# Patient Record
Sex: Male | Born: 1970 | Hispanic: No | Marital: Married | Smoking: Never smoker
Health system: Southern US, Community
[De-identification: ages and names within clinical notes are randomized; demographics above are authoritative.]

## PROBLEM LIST (undated history)

## (undated) DIAGNOSIS — R51 Headache: Secondary | ICD-10-CM

## (undated) DIAGNOSIS — E559 Vitamin D deficiency, unspecified: Secondary | ICD-10-CM

## (undated) DIAGNOSIS — E7800 Pure hypercholesterolemia, unspecified: Secondary | ICD-10-CM

## (undated) DIAGNOSIS — J302 Other seasonal allergic rhinitis: Secondary | ICD-10-CM

## (undated) DIAGNOSIS — D490 Neoplasm of unspecified behavior of digestive system: Secondary | ICD-10-CM

## (undated) DIAGNOSIS — E119 Type 2 diabetes mellitus without complications: Secondary | ICD-10-CM

## (undated) DIAGNOSIS — R519 Headache, unspecified: Secondary | ICD-10-CM

## (undated) DIAGNOSIS — K219 Gastro-esophageal reflux disease without esophagitis: Secondary | ICD-10-CM

## (undated) DIAGNOSIS — G4700 Insomnia, unspecified: Secondary | ICD-10-CM

## (undated) DIAGNOSIS — I10 Essential (primary) hypertension: Secondary | ICD-10-CM

## (undated) HISTORY — PX: UPPER GI ENDOSCOPY: SHX6162

## (undated) HISTORY — PX: WISDOM TOOTH EXTRACTION: SHX21

---

## 2017-09-03 ENCOUNTER — Other Ambulatory Visit (HOSPITAL_COMMUNITY): Payer: Self-pay | Admitting: Surgery

## 2017-09-15 ENCOUNTER — Encounter: Payer: BLUE CROSS/BLUE SHIELD | Attending: Surgery | Admitting: Skilled Nursing Facility1

## 2017-09-15 ENCOUNTER — Ambulatory Visit (HOSPITAL_COMMUNITY)
Admission: RE | Admit: 2017-09-15 | Discharge: 2017-09-15 | Disposition: A | Discharge status: Home / Self Care | Payer: BLUE CROSS/BLUE SHIELD | Source: Ambulatory Visit | Attending: Surgery | Admitting: Surgery

## 2017-09-15 ENCOUNTER — Other Ambulatory Visit: Payer: Self-pay

## 2017-09-15 ENCOUNTER — Encounter: Payer: Self-pay | Admitting: Skilled Nursing Facility1

## 2017-09-15 DIAGNOSIS — I498 Other specified cardiac arrhythmias: Secondary | ICD-10-CM | POA: Insufficient documentation

## 2017-09-15 DIAGNOSIS — Z01810 Encounter for preprocedural cardiovascular examination: Secondary | ICD-10-CM | POA: Insufficient documentation

## 2017-09-15 DIAGNOSIS — Z01818 Encounter for other preprocedural examination: Secondary | ICD-10-CM | POA: Insufficient documentation

## 2017-09-15 DIAGNOSIS — J9811 Atelectasis: Secondary | ICD-10-CM | POA: Insufficient documentation

## 2017-09-15 DIAGNOSIS — Z6838 Body mass index (BMI) 38.0-38.9, adult: Secondary | ICD-10-CM | POA: Diagnosis not present

## 2017-09-15 DIAGNOSIS — E669 Obesity, unspecified: Secondary | ICD-10-CM

## 2017-09-15 NOTE — Progress Notes (Signed)
Pre-Op Assessment Visit:  Pre-Operative RYGB Surgery  Medical Nutrition Therapy:  Appt start time: 12:30  End time:  1:15  Patient was seen on 09/15/2017 for Pre-Operative Nutrition Assessment. Assessment and letter of approval faxed to [facility] Bariatric Surgery Program coordinator on 09/15/2017.    Pt states from not sleeping well at night has mood changes like anger. Pt has a physically active job. Pt states he has been trying to only have decaf coffee. Pt staets he will not be able to come back for [facility] appointment due to work schedule so he will call [facility].    Start weight at [facility]: 237.7 BMI: 38.95  24 hr Dietary Recall: First Meal: skipped Snack:  Second Meal: fast food subs or pizza  Snack: fruit Third Meal: pizza or naan lentils and rice  Snack:  Beverages: decaff coffee,   Encouraged to engage in 150 minutes of moderate physical activity including cardiovascular and weight baring weekly  Handouts given during visit include:  . Pre-Op Goals . Bariatric Surgery Protein Shakes During the appointment today the following Pre-Op Goals were reviewed with the patient: . Maintain or lose weight as instructed by your surgeon . Make healthy food choices . Begin to limit portion sizes . Limited concentrated sugars and fried foods . Keep fat/sugar in the single digits per serving on             food labels . Practice CHEWING your food  (aim for 30 chews per bite or until applesauce consistency) . Practice not drinking 15 minutes before, during, and 30 minutes after each meal/snack . Avoid all carbonated beverages  . Avoid/limit caffeinated beverages  . Avoid all sugar-sweetened beverages . Consume 3 meals per day; eat every 3-5 hours . Make a list of non-food related activities . Aim for 64-100 ounces of FLUID daily  . Aim for at least 60-80 grams of PROTEIN daily . Look for a liquid protein source that contain ?15 g protein and ?5 g carbohydrate  (ex: shakes,  drinks, shots)  -Follow diet recommendations listed below   Energy and Macronutrient Recomendations: Calories: 1600 Carbohydrate: 180 Protein: 120 Fat: 44  Demonstrated degree of understanding via:  Teach Back  Teaching Method Utilized:  Visual Auditory Hands on  Barriers to learning/adherence to lifestyle change: none identified  Patient to call the [facility] to enroll in Pre-Op and Post-Op Nutrition Education when surgery date is scheduled.

## 2017-10-11 ENCOUNTER — Encounter: Payer: BLUE CROSS/BLUE SHIELD | Attending: Surgery | Admitting: Skilled Nursing Facility1

## 2017-10-11 ENCOUNTER — Encounter: Payer: Self-pay | Admitting: Skilled Nursing Facility1

## 2017-10-11 DIAGNOSIS — Z6838 Body mass index (BMI) 38.0-38.9, adult: Secondary | ICD-10-CM | POA: Insufficient documentation

## 2017-10-11 DIAGNOSIS — E669 Obesity, unspecified: Secondary | ICD-10-CM

## 2017-10-11 NOTE — Progress Notes (Signed)
RYGB Assessment:   1st SWL Appointment.   Pt states from not sleeping well at night has mood changes like anger. Pt has a physically active job. Pt states he has been trying to only have decaf coffee.    Pt states he needs 2 more SWL appointments. Pt states he has been trying to chew 28 times and states he is doing good. Pt states his wife has had bariatric surgery.  Hypertension, GERD, Hypercholesterolemia   Start weight at [facility]: 237.7 Wt: 238.3 BMI: 39.05  MEDICATIONS: see List    DIETARY INTAKE:  24-hr recall:  B ( AM): skipped Snk ( AM):  L ( PM): fast food or soup or 1-2 cans of tuna  Snk ( PM): hot cheetos  D ( PM): fish or chicken or restaurant food Snk ( PM):  Beverages: decaff coffee, 3-4 bottles of water  Usual physical activity: Active job and som treadmill   Diet to Follow: 1600 calories 180 g carbohydrates 120 g protein 44 g fat   Nutritional Diagnosis:  [place]-3.3 Overweight/obesity related to past poor dietary habits and physical inactivity as evidenced by patient w/ planned RYGB surgery following dietary guidelines for continued weight loss.    Intervention:  Nutrition counseling for upcoming Bariatric Surgery. Goals: -Encouraged to engage in 150 minutes of moderate physical activity including cardiovascular and weight baring weekly -Work on eating 3 meals a day  Teaching Method Utilized:  Visual Auditory Hands on  Barriers to learning/adherence to lifestyle change: none identified   Demonstrated degree of understanding via:  Teach Back   Monitoring/Evaluation:  Dietary intake, exercise, and body weight prn.

## 2017-10-28 ENCOUNTER — Ambulatory Visit: Payer: Self-pay | Admitting: Psychiatry

## 2017-11-11 ENCOUNTER — Ambulatory Visit: Payer: BLUE CROSS/BLUE SHIELD | Admitting: Psychiatry

## 2017-12-13 ENCOUNTER — Encounter: Payer: Self-pay | Admitting: Skilled Nursing Facility1

## 2017-12-13 ENCOUNTER — Encounter: Payer: BLUE CROSS/BLUE SHIELD | Attending: Surgery | Admitting: Skilled Nursing Facility1

## 2017-12-13 DIAGNOSIS — Z6838 Body mass index (BMI) 38.0-38.9, adult: Secondary | ICD-10-CM | POA: Insufficient documentation

## 2017-12-13 NOTE — Progress Notes (Signed)
RYGB Assessment:   2nd SWL Appointment.   Pt states from not sleeping well at night has mood changes like anger. Pt has a physically active job. Pt states he has been trying to only have decaf coffee.    Pt states he needs 1 more SWL appointments. Pt states he has been trying to chew 28 times and states he is doing good. Pt states his wife has had bariatric surgery.  Hypertension, GERD, Hypercholesterolemia  Pt arrives having lost about 2 pounds. Pt states he has been working on eating throughout the day. Pt states he has been trying to eat less fast food. Pt state she has had more energy and is feeling better. Pt states he is always bloated and gassy 30 minutes after lunch.    Start weight at [facility]: 237.7 Wt: 236 BMI: 38.81  MEDICATIONS: see List    DIETARY INTAKE:  24-hr recall:  B ( AM): skipped or banana  Snk ( AM):  L ( PM): fast food or soup or 1-2 cans of tuna  Snk ( PM): hot cheetos  D ( PM): fish or chicken or restaurant food or sushi and nuts or wings  Snk ( PM): cheetos Beverages: decaff coffee, 3-4 bottles of water  Usual physical activity: Active job and some treadmill   Diet to Follow: 1600 calories 180 g carbohydrates 120 g protein 44 g fat   Nutritional Diagnosis:  [place]-3.3 Overweight/obesity related to past poor dietary habits and physical inactivity as evidenced by patient w/ planned RYGB surgery following dietary guidelines for continued weight loss.    Intervention:  Nutrition counseling for upcoming Bariatric Surgery. Goals: -Encouraged to engage in 150 minutes of moderate physical activity including cardiovascular and weight baring weekly -Work on eating 3 meals a day: eat breakfast   Teaching Method Utilized:  Visual Auditory Hands on  Barriers to learning/adherence to lifestyle change: none identified   Demonstrated degree of understanding via:  Teach Back   Monitoring/Evaluation:  Dietary intake, exercise, and body weight prn.

## 2017-12-23 ENCOUNTER — Ambulatory Visit: Payer: BLUE CROSS/BLUE SHIELD | Admitting: Psychiatry

## 2017-12-23 ENCOUNTER — Ambulatory Visit (INDEPENDENT_AMBULATORY_CARE_PROVIDER_SITE_OTHER): Payer: BLUE CROSS/BLUE SHIELD | Admitting: Psychiatry

## 2017-12-23 DIAGNOSIS — F509 Eating disorder, unspecified: Secondary | ICD-10-CM | POA: Diagnosis not present

## 2017-12-24 DIAGNOSIS — Z6841 Body Mass Index (BMI) 40.0 and over, adult: Secondary | ICD-10-CM | POA: Diagnosis not present

## 2017-12-24 DIAGNOSIS — E559 Vitamin D deficiency, unspecified: Secondary | ICD-10-CM | POA: Diagnosis not present

## 2018-01-03 DIAGNOSIS — Z6841 Body Mass Index (BMI) 40.0 and over, adult: Secondary | ICD-10-CM | POA: Diagnosis not present

## 2018-01-03 DIAGNOSIS — E559 Vitamin D deficiency, unspecified: Secondary | ICD-10-CM | POA: Diagnosis not present

## 2018-01-10 ENCOUNTER — Encounter: Payer: BLUE CROSS/BLUE SHIELD | Attending: Surgery | Admitting: Skilled Nursing Facility1

## 2018-01-10 ENCOUNTER — Encounter: Payer: Self-pay | Admitting: Skilled Nursing Facility1

## 2018-01-10 DIAGNOSIS — Z6838 Body mass index (BMI) 38.0-38.9, adult: Secondary | ICD-10-CM | POA: Insufficient documentation

## 2018-01-10 DIAGNOSIS — E669 Obesity, unspecified: Secondary | ICD-10-CM

## 2018-01-10 NOTE — Progress Notes (Signed)
RYGB Assessment: 3rd SWL Appointment.   Pt states from not sleeping well at night has mood changes like anger. Pt has a physically active job. Pt states he has been trying to only have decaf coffee.    Pt states he needs 1 more SWL appointments. Pt states he has been trying to chew 28 times and states he is doing good. Pt states his wife has had bariatric surgery.  Hypertension, GERD, Hypercholesterolemia Pt states he has been working on eating throughout the day. Pt states he has been trying to eat less fast food. Pt state she has had more energy and is feeling better. Pt states he is always bloated and gassy 30 minutes after lunch.  Arrived having Lost 3 pounds. Pt states he is eating a lot more fish and soup and eating less fast food. Pt states he has trouble sleeping at night taking medication which helps a little.  Pt states Dr. Hassell Done will call him and tell him if he needs another SWL: Dietitian advised he call [facility] when he knows.    Start weight at [facility]: 237.7 Wt: 233 BMI: 38.22  MEDICATIONS: see List    DIETARY INTAKE:  24-hr recall:  B ( AM): skipped or banana  Snk ( AM):  L ( PM): fast food or soup or 1-2 cans of tuna  Snk ( PM): hot cheetos  D ( PM): fish or chicken or restaurant food or sushi and nuts or wings  Snk ( PM): cheetos Beverages: decaff coffee, 3-4 bottles of water  Usual physical activity: Active job and treadmill for 60 minutes 5 days a week   Diet to Follow: 1600 calories 180 g carbohydrates 120 g protein 44 g fat   Nutritional Diagnosis:  [place]-3.3 Overweight/obesity related to past poor dietary habits and physical inactivity as evidenced by patient w/ planned RYGB surgery following dietary guidelines for continued weight loss.    Intervention:  Nutrition counseling for upcoming Bariatric Surgery. Goals: -Encouraged to engage in 150 minutes of moderate physical activity including cardiovascular and weight baring weekly -Work on eating 3 meals a day:  eat breakfast; try protein shake in the morning  -chew small bites until applesauce consistency  -Do not drink with meals Teaching Method Utilized:  Visual Auditory Hands on  Barriers to learning/adherence to lifestyle change: none identified   Demonstrated degree of understanding via:  Teach Back   Monitoring/Evaluation:  Dietary intake, exercise, and body weight prn.

## 2018-01-18 ENCOUNTER — Ambulatory Visit (INDEPENDENT_AMBULATORY_CARE_PROVIDER_SITE_OTHER): Payer: BLUE CROSS/BLUE SHIELD | Admitting: Psychiatry

## 2018-01-18 DIAGNOSIS — F509 Eating disorder, unspecified: Secondary | ICD-10-CM | POA: Diagnosis not present

## 2018-02-07 DIAGNOSIS — J029 Acute pharyngitis, unspecified: Secondary | ICD-10-CM | POA: Diagnosis not present

## 2018-02-07 DIAGNOSIS — J069 Acute upper respiratory infection, unspecified: Secondary | ICD-10-CM | POA: Diagnosis not present

## 2018-02-22 ENCOUNTER — Encounter: Payer: Self-pay | Admitting: Skilled Nursing Facility1

## 2018-02-22 ENCOUNTER — Encounter: Payer: BLUE CROSS/BLUE SHIELD | Attending: Surgery | Admitting: Skilled Nursing Facility1

## 2018-02-22 DIAGNOSIS — Z6838 Body mass index (BMI) 38.0-38.9, adult: Secondary | ICD-10-CM | POA: Diagnosis not present

## 2018-02-22 DIAGNOSIS — E669 Obesity, unspecified: Secondary | ICD-10-CM

## 2018-02-22 NOTE — Progress Notes (Signed)
RYGB Assessment: 4th SWL Appointment.   Pt arrives having gained about 3 pounds. Pt states for the last 2 weeks he has been very tired after work realizing he was not eating enough for lunch. Pt states he will not have anything he will struggle with after surgery.    Start weight at [facility]: 237.7 Wt: 236 BMI: 38.74  MEDICATIONS: see List    DIETARY INTAKE:  24-hr recall:  B ( AM): skipped or banana or cereal or waffle  Snk ( AM):  L ( PM): fast food or soup or 1-2 cans of tuna or something he cannot remember the name of or ramen oodles with chicken  Snk ( PM): hot cheetos  D ( PM): fish or chicken or restaurant food or sushi and nuts or wings or fast food Snk ( PM): cheetos Beverages: decaff coffee, 3-4 bottles of water  Usual physical activity: Active job   Diet to Follow: 1600 calories 180 g carbohydrates 120 g protein 44 g fat   Nutritional Diagnosis:  [place]-3.3 Overweight/obesity related to past poor dietary habits and physical inactivity as evidenced by patient w/ planned RYGB surgery following dietary guidelines for continued weight loss.    Intervention:  Nutrition counseling for upcoming Bariatric Surgery. Goals: -Encouraged to engage in 150 minutes of moderate physical activity including cardiovascular and weight baring weekly -Work on eating 3 meals a day: eat breakfast; try protein shake in the morning  -chew small bites until applesauce consistency  -Do not drink with meals Teaching Method Utilized:  Visual Auditory Hands on  Barriers to learning/adherence to lifestyle change: none identified   Demonstrated degree of understanding via:  Teach Back   Monitoring/Evaluation:  Dietary intake, exercise, and body weight prn.

## 2018-03-21 ENCOUNTER — Ambulatory Visit: Payer: Self-pay

## 2018-05-28 DIAGNOSIS — Z6841 Body Mass Index (BMI) 40.0 and over, adult: Secondary | ICD-10-CM | POA: Diagnosis not present

## 2018-05-30 DIAGNOSIS — K1321 Leukoplakia of oral mucosa, including tongue: Secondary | ICD-10-CM | POA: Diagnosis not present

## 2018-05-30 DIAGNOSIS — K1329 Other disturbances of oral epithelium, including tongue: Secondary | ICD-10-CM | POA: Diagnosis not present

## 2018-06-06 ENCOUNTER — Ambulatory Visit: Payer: Self-pay

## 2018-06-13 ENCOUNTER — Encounter: Payer: BLUE CROSS/BLUE SHIELD | Attending: Surgery | Admitting: Registered"

## 2018-06-13 DIAGNOSIS — E781 Pure hyperglyceridemia: Secondary | ICD-10-CM | POA: Diagnosis not present

## 2018-06-13 DIAGNOSIS — K219 Gastro-esophageal reflux disease without esophagitis: Secondary | ICD-10-CM | POA: Diagnosis not present

## 2018-06-13 DIAGNOSIS — Z6836 Body mass index (BMI) 36.0-36.9, adult: Secondary | ICD-10-CM | POA: Insufficient documentation

## 2018-06-13 DIAGNOSIS — Z79899 Other long term (current) drug therapy: Secondary | ICD-10-CM | POA: Insufficient documentation

## 2018-06-13 DIAGNOSIS — Z713 Dietary counseling and surveillance: Secondary | ICD-10-CM | POA: Insufficient documentation

## 2018-06-13 DIAGNOSIS — Z8249 Family history of ischemic heart disease and other diseases of the circulatory system: Secondary | ICD-10-CM | POA: Diagnosis not present

## 2018-06-13 DIAGNOSIS — E669 Obesity, unspecified: Secondary | ICD-10-CM

## 2018-06-13 NOTE — Progress Notes (Signed)
  Pre-Operative Nutrition Class:  Appt start time: 8:15   End time:  9:15.  Patient was seen on 06/13/2018 for Pre-Operative Bariatric Surgery Education [facility].   Surgery date: TBD Surgery type: RYGB Start weight at [facility]: 237.7 Weight today: 222.0   Samples given per MNT protocol. Patient educated on appropriate usage: Bariatric Advantage Multivitamin Lot # [id number] Exp: 08/2019  Bariatric Advantage Calcium Citrate Lot # [id number] Exp: 12/06/2018  Bariatric Advantage Calcium Citrate Lot # [id number] Exp: 03/17/2019  Premier Protein Shake Lot # [id number] Exp: 02/22/2019   The following the learning objectives were met by the patient during this course:  Identify Pre-Op Dietary Goals and will begin 2 weeks pre-operatively  Identify appropriate sources of fluids and proteins   State protein recommendations and appropriate sources pre and post-operatively  Identify Post-Operative Dietary Goals and will follow for 2 weeks post-operatively  Identify appropriate multivitamin and calcium sources  Describe the need for physical activity post-operatively and will follow MD recommendations  State when to call healthcare provider regarding medication questions or post-operative complications  Handouts given during class include:  Pre-Op Bariatric Surgery Diet Handout  Protein Shake Handout  Post-Op Bariatric Surgery Nutrition Handout  BEL[facility] Program Information Flyer  Support Group Information Flyer  [facility] Bariatric Supplements Price List  Follow-Up Plan: Patient will follow-up at [facility] 2 weeks post operatively for diet advancement per MD.

## 2018-07-06 DIAGNOSIS — K1230 Oral mucositis (ulcerative), unspecified: Secondary | ICD-10-CM | POA: Diagnosis not present

## 2018-07-06 DIAGNOSIS — D3709 Neoplasm of uncertain behavior of other specified sites of the oral cavity: Secondary | ICD-10-CM | POA: Diagnosis not present

## 2018-08-17 DIAGNOSIS — K011 Impacted teeth: Secondary | ICD-10-CM | POA: Diagnosis not present

## 2018-10-01 IMAGING — DX DG CHEST 2V
2 series · 2 of 2 positions shown · non-contrast
Comparison: None.

CLINICAL DATA: Shortness of breath for 2 years, preoperative
evaluation for gastric surgery

EXAM:
CHEST  2 VIEW

[chest pa]
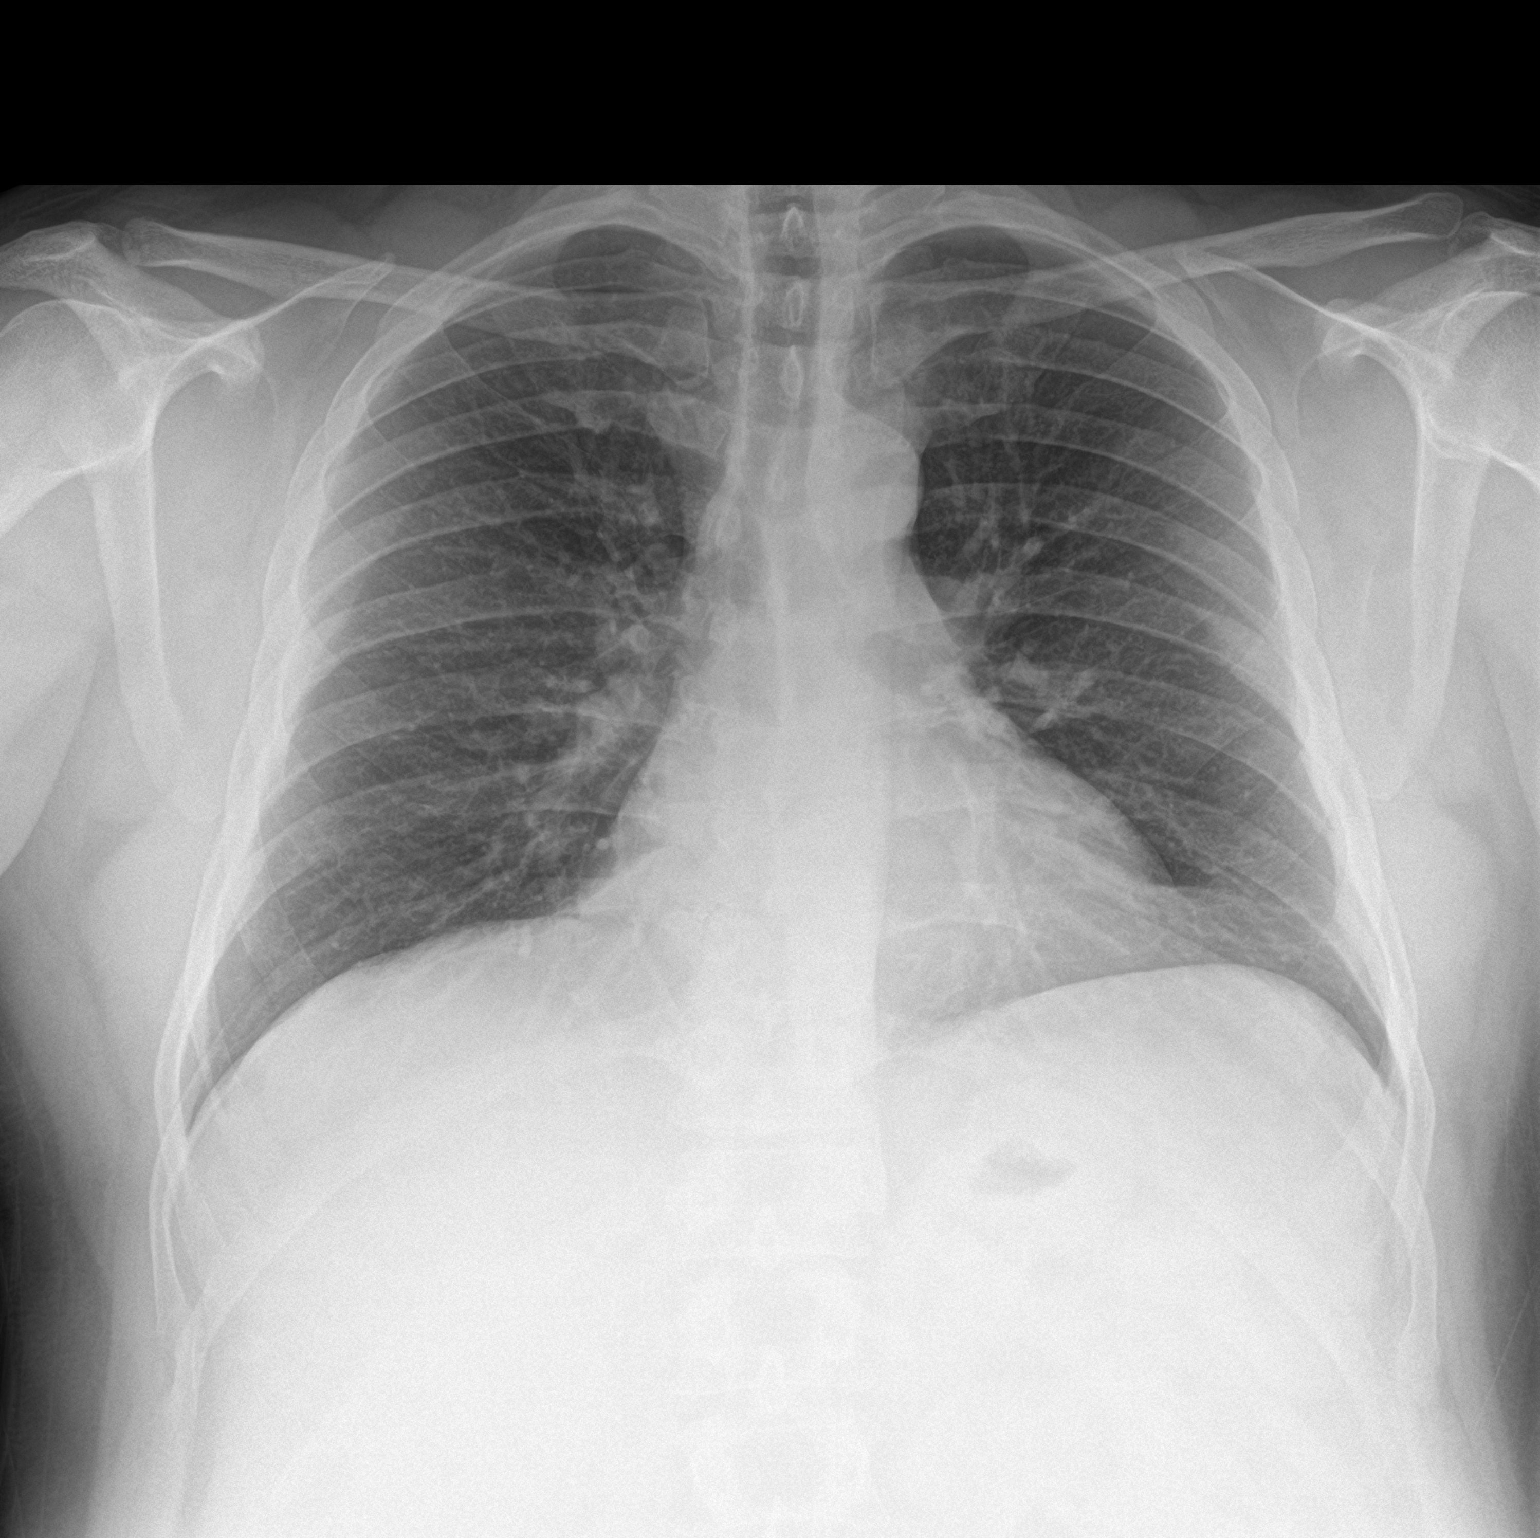

[chest lat]
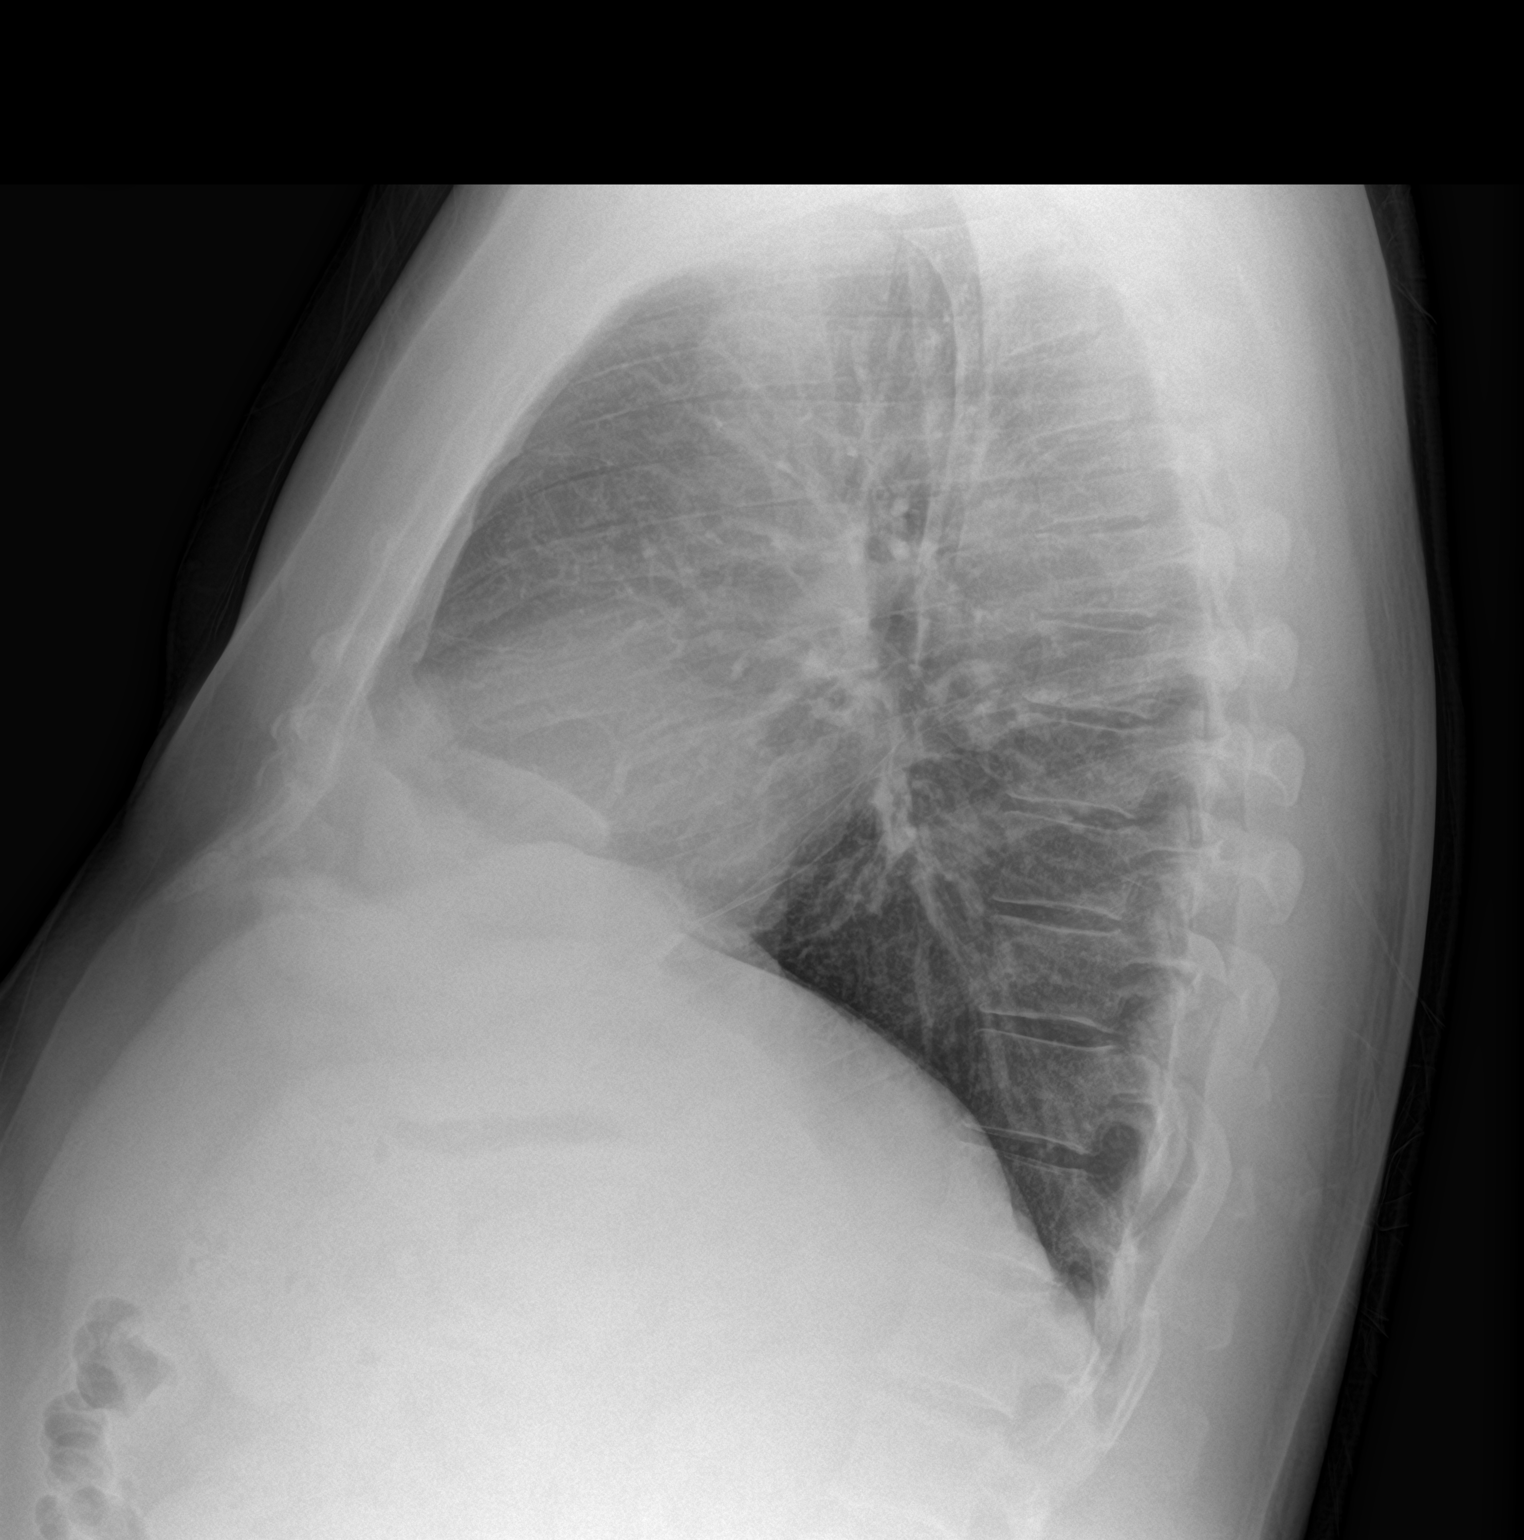

[2 of 2 positions shown; findings below may reference images not displayed]

FINDINGS: The heart size and mediastinal contours are within normal limits.
Both lungs are well aerated bilaterally. Minimal lingular
atelectasis is seen. The visualized skeletal structures are
unremarkable.
IMPRESSION: Minimal lingular atelectasis.

## 2018-10-19 ENCOUNTER — Telehealth: Payer: Self-pay | Admitting: Skilled Nursing Facility1

## 2018-10-19 NOTE — Telephone Encounter (Signed)
Dietitian called pt to assess their understanding of the pre-op nutrition recommendations through the teach back method to ensure the pts knowledge readiness in preparation for surgery.     Pt was confused and wanted to know when his surgery date is. Pt states he has been calling [facility] with no response form them so he does not know when he is having surgery or when any of his appts are.   Dietitian told pt she does not know that side of his process she just needed to educate him on the pre-op nutrition information. Pt stated he will call [facility] to set up another pre-op class because he does not remember the information. Pt states he will call [facility] again to find out when he is getting surgery.

## 2018-10-19 NOTE — Telephone Encounter (Signed)
Dietitian called pt to assess their understanding of the pre-op nutrition recommendations through the teach back method to ensure the pts knowledge readiness in preparation for surgery.     Dietitian LVM.

## 2018-10-21 ENCOUNTER — Ambulatory Visit: Payer: Self-pay | Admitting: Surgery

## 2018-10-21 DIAGNOSIS — E8881 Metabolic syndrome: Secondary | ICD-10-CM | POA: Diagnosis not present

## 2018-11-01 ENCOUNTER — Inpatient Hospital Stay: Admit: 2018-11-01 | Payer: BLUE CROSS/BLUE SHIELD | Admitting: Surgery

## 2018-11-01 SURGERY — LAPAROSCOPIC ROUX-EN-Y GASTRIC BYPASS WITH UPPER ENDOSCOPY
Anesthesia: General

## 2018-11-15 DIAGNOSIS — Z6841 Body Mass Index (BMI) 40.0 and over, adult: Secondary | ICD-10-CM | POA: Diagnosis not present

## 2019-01-04 DIAGNOSIS — K1230 Oral mucositis (ulcerative), unspecified: Secondary | ICD-10-CM | POA: Diagnosis not present

## 2019-01-04 DIAGNOSIS — H9209 Otalgia, unspecified ear: Secondary | ICD-10-CM | POA: Diagnosis not present

## 2019-01-19 ENCOUNTER — Encounter (HOSPITAL_COMMUNITY): Payer: Self-pay

## 2019-01-19 NOTE — Patient Instructions (Addendum)
Your procedure is scheduled on:  Tuesday, January 31, 2019   Surgery Time:  7:15AM-10:34AM   Report to [facility]  Entrance    Report to Short Stay at 5:30AM   Call this number if you have problems the morning of surgery [phone]   NO SOLID FOOD AFTER 600 PM THE NIGHT BEFORE YOUR SURGERY. YOU MAY DRINK CLEAR FLUIDS.   CLEAR LIQUID DIET  Foods Allowed                                                                     Foods Excluded  Water, Black Coffee and tea, regular and decaf                             liquids that you cannot  Plain Jell-O in any flavor                                             see through such as: Fruit ices (not with fruit pulp)                                     milk, soups, orange juice  Iced Popsicles                                    All solid food Carbonated beverages, regular and diet                                    Cranberry, grape and apple juices Sports drinks like Gatorade Lightly seasoned clear broth or consume(fat free) Sugar, honey syrup  Sample Menu Breakfast                                Lunch                                     Supper Cranberry juice                    Beef broth                            Chicken broth Jell-O                                     Grape juice                           Apple juice Coffee or tea  Jell-O                                      Popsicle                                                Coffee or tea                        Coffee or tea   MORNING OF SURGERY DRINK:  1 G2 low sugar Gatorade drink  BEFORE YOU LEAVE HOME, DRINK ALL OF IT AT ONE TIME.  THE GATORADE YOU DRINK BEFORE YOU LEAVE HOME WILL BE  THE LAST FLUIDS YOU DRINK BEFORE SURGERY.   Brush your teeth the morning of surgery.   Do NOT smoke after Midnight   Take these medicines the morning of surgery with A SIP OF WATER:   DO NOT TAKE ANY DIABETIC MEDICATIONS DAY OF YOUR SURGERY                                You may not have any metal on your body including jewelry, and body piercings             Do not wear lotions, powders, perfumes/cologne, or deodorant                          Men may shave face and neck.   Do not bring valuables to the hospital. [place].   Contacts, dentures or bridgework may not be worn into surgery.   Leave suitcase in the car. After surgery it may be brought to your room.     PAIN IS EXPECTED AFTER SURGERY AND WILL NOT BE COMPLETELY ELIMINATED. AMBULATION AND TYLENOL WILL HELP REDUCE INCISIONAL AND GAS PAIN. MOVEMENT IS KEY!   YOU ARE EXPECTED TO BE OUT OF BED WITHIN 4 HOURS OF ADMISSION TO YOUR PATIENT ROOM.   SITTING IN THE RECLINER THROUGHOUT THE DAY IS IMPORTANT FOR DRINKING FLUIDS AND MOVING GAS THROUGHOUT THE GI TRACT.   COMPRESSION STOCKINGS SHOULD BE WORN [place] UNLESS YOU ARE WALKING.    INCENTIVE SPIROMETER SHOULD BE USED EVERY HOUR WHILE AWAKE TO DECREASE POST-OPERATIVE COMPLICATIONS SUCH AS PNEUMONIA.   WHEN DISCHARGED HOME, IT IS IMPORTANT TO CONTINUE TO WALK EVERY HOUR AND USE THE INCENTIVE SPIROMETER EVERY HOUR.    Special Instructions: Bring a copy of your healthcare power of attorney and living will documents         the day of surgery if you haven't scanned them in before.              Please read over the following fact sheets you were given:    [facility] - Preparing for Surgery Before surgery, you can play an important role.  Because skin is not sterile, your skin needs to be as free of germs as possible.  You can reduce the number of germs on your skin by washing with CHG (chlorahexidine gluconate) soap before surgery.  CHG is an antiseptic cleaner which kills  germs and bonds with the skin to continue killing germs even after washing. Please DO NOT use if you have an allergy to CHG or antibacterial soaps.  If your skin becomes reddened/irritated stop  using the CHG and inform your nurse when you arrive at Short Stay. Do not shave (including legs and underarms) for at least 48 hours prior to the first CHG shower.  You may shave your face/neck.  Please follow these instructions carefully:  1.  Shower with CHG Soap the night before surgery and the  morning of surgery.  2.  If you choose to wash your hair, wash your hair first as usual with your normal  shampoo.  3.  After you shampoo, rinse your hair and body thoroughly to remove the shampoo.                             4.  Use CHG as you would any other liquid soap.  You can apply chg directly to the skin and wash.  Gently with a scrungie or clean washcloth.  5.  Apply the CHG Soap to your body ONLY FROM THE NECK DOWN.   Do   not use on face/ open                           Wound or open sores. Avoid contact with eyes, ears mouth and   genitals (private parts).                       Wash face,  Genitals (private parts) with your normal soap.             6.  Wash thoroughly, paying special attention to the area where your    surgery  will be performed.  7.  Thoroughly rinse your body with warm water from the neck down.  8.  DO NOT shower/wash with your normal soap after using and rinsing off the CHG Soap.                9.  Pat yourself dry with a clean towel.            10.  Wear clean pajamas.            11.  Place clean sheets on your bed the night of your first shower and do not  sleep with pets. Day of Surgery : Do not apply any lotions/deodorants the morning of surgery.  Please wear clean clothes to the hospital/surgery center.  FAILURE TO FOLLOW THESE INSTRUCTIONS MAY RESULT IN THE CANCELLATION OF YOUR SURGERY  PATIENT SIGNATURE_________________________________  NURSE SIGNATURE__________________________________  ________________________________________________________________________

## 2019-01-20 ENCOUNTER — Encounter (HOSPITAL_COMMUNITY)
Admission: RE | Admit: 2019-01-20 | Discharge: 2019-01-20 | Disposition: A | Discharge status: Home / Self Care | Payer: BLUE CROSS/BLUE SHIELD | Source: Ambulatory Visit | Attending: Surgery | Admitting: Surgery

## 2019-01-20 DIAGNOSIS — Z01818 Encounter for other preprocedural examination: Secondary | ICD-10-CM | POA: Diagnosis not present

## 2019-01-20 HISTORY — DX: Neoplasm of unspecified behavior of digestive system: D49.0

## 2019-01-20 HISTORY — DX: Vitamin D deficiency, unspecified: E55.9

## 2019-01-20 HISTORY — DX: Insomnia, unspecified: G47.00

## 2019-01-20 HISTORY — DX: Morbid (severe) obesity due to excess calories: E66.01

## 2019-01-20 HISTORY — DX: Gastro-esophageal reflux disease without esophagitis: K21.9

## 2019-01-27 ENCOUNTER — Telehealth: Payer: Self-pay | Admitting: Dietician

## 2019-01-27 NOTE — Patient Instructions (Addendum)
Mark Lang  01/27/2019   Your procedure is scheduled on: 01-31-2019   Report to Surgery By [facility] Main  Entrance     Report to [place] at 5:30AM    Call this number if you have problems the morning of surgery [phone]      Remember: NO SOLID FOOD AFTER 6:00 PM THE NIGHT BEFORE YOUR SURGERY. YOU MAY DRINK CLEAR FLUIDS (SEE BELOW). ON THA MORNING OF SURGERY DRINK:  ONE G2 GATORADE  BEFORE YOU LEAVE HOME, DRINK ALL OF THE GATORADE AT ONE TIME. DO NOT CONSUME ANYTHING BY MOUTH AFTER DRINKING THE GATORADE   BRUSH YOUR TEETH MORNING OF SURGERY AND RINSE YOUR MOUTH OUT, NO CHEWING GUM CANDY OR MINTS.      CLEAR LIQUID DIET   Foods Allowed                                                                     Foods Excluded  Coffee and tea, regular and decaf                             liquids that you cannot  Plain Jell-O in any flavor                                             see through such as: Fruit ices (not with fruit pulp)                                     milk, soups, orange juice  Iced Popsicles                                    All solid food Carbonated beverages, regular and diet                                    Cranberry, grape and apple juices Sports drinks like Gatorade Lightly seasoned clear broth or consume(fat free) Sugar, honey syrup  Sample Menu Breakfast                                Lunch                                     Supper Cranberry juice                    Beef broth                            Chicken broth Jell-O  Grape juice                           Apple juice Coffee or tea                        Jell-O                                      Popsicle                                                Coffee or tea                        Coffee or tea  _____________________________________________________________________     PAIN IS EXPECTED AFTER SURGERY AND WILL NOT BE COMPLETELY  ELIMINATED. AMBULATION AND TYLENOL WILL HELP REDUCE INCISIONAL AND GAS PAIN. MOVEMENT IS KEY!  YOU ARE EXPECTED TO BE OUT OF BED WITHIN 4 HOURS OF ADMISSION TO YOUR PATIENT ROOM.  SITTING IN THE RECLINER THROUGHOUT THE DAY IS IMPORTANT FOR DRINKING FLUIDS AND MOVING GAS THROUGHOUT THE GI TRACT.  COMPRESSION STOCKINGS SHOULD BE WORN [name] UNLESS YOU ARE WALKING.   INCENTIVE SPIROMETER SHOULD BE USED EVERY HOUR WHILE AWAKE TO DECREASE POST-OPERATIVE COMPLICATIONS SUCH AS PNEUMONIA.  WHEN DISCHARGED HOME, IT IS IMPORTANT TO CONTINUE TO WALK EVERY HOUR AND USE THE INCENTIVE SPIROMETER EVERY HOUR.             Take these medicines the morning of surgery with A SIP OF WATER: Metoprolol, Omeprazole  DO NOT TAKE ANY DIABETES MEDICATION THE DAY OF SURGERY!                                 You may not have any metal on your body including hair pins and              piercings  Do not wear jewelry, make-up, lotions, powders or perfumes, deodorant              Men may shave face and neck.   Do not bring valuables to the hospital. [name].  Contacts, dentures or bridgework may not be worn into surgery.  Leave suitcase in the car. After surgery it may be brought to your room.                Please read over the following fact sheets you were given: _____________________________________________________________________             [name] - Preparing for Surgery Before surgery, you can play an important role.  Because skin is not sterile, your skin needs to be as free of germs as possible.  You can reduce the number of germs on your skin by washing with CHG (chlorahexidine gluconate) soap before surgery.  CHG is an antiseptic cleaner which kills germs and bonds with the skin to continue killing germs even after washing. Please DO NOT use if you have an allergy to CHG or antibacterial soaps.  If  your skin becomes  reddened/irritated stop using the CHG and inform your nurse when you arrive at Short Stay. Do not shave (including legs and underarms) for at least 48 hours prior to the first CHG shower.  You may shave your face/neck. Please follow these instructions carefully:  1.  Shower with CHG Soap the night before surgery and the  morning of Surgery.  2.  If you choose to wash your hair, wash your hair first as usual with your  normal  shampoo.  3.  After you shampoo, rinse your hair and body thoroughly to remove the  shampoo.                           4.  Use CHG as you would any other liquid soap.  You can apply chg directly  to the skin and wash                       Gently with a scrungie or clean washcloth.  5.  Apply the CHG Soap to your body ONLY FROM THE NECK DOWN.   Do not use on face/ open                           Wound or open sores. Avoid contact with eyes, ears mouth and genitals (private parts).                       Wash face,  Genitals (private parts) with your normal soap.             6.  Wash thoroughly, paying special attention to the area where your surgery  will be performed.  7.  Thoroughly rinse your body with warm water from the neck down.  8.  DO NOT shower/wash with your normal soap after using and rinsing off  the CHG Soap.                9.  Pat yourself dry with a clean towel.            10.  Wear clean pajamas.            11.  Place clean sheets on your bed the night of your first shower and do not  sleep with pets. Day of Surgery : Do not apply any lotions/deodorants the morning of surgery.  Please wear clean clothes to the hospital/surgery center.  FAILURE TO FOLLOW THESE INSTRUCTIONS MAY RESULT IN THE CANCELLATION OF YOUR SURGERY PATIENT SIGNATURE_________________________________  NURSE SIGNATURE__________________________________  ________________________________________________________________________     Mark Lang  An incentive spirometer is a tool  that can help keep your lungs clear and active. This tool measures how well you are filling your lungs with each breath. Taking long deep breaths may help reverse or decrease the chance of developing breathing (pulmonary) problems (especially infection) following:  A long period of time when you are unable to move or be active. BEFORE THE PROCEDURE   If the spirometer includes an indicator to show your best effort, your nurse or respiratory therapist will set it to a desired goal.  If possible, sit up straight or lean slightly forward. Try not to slouch.  Hold the incentive spirometer in an upright position. INSTRUCTIONS FOR USE  1. Sit on the edge of your bed if possible, or sit up as far as you can in bed or  on a chair. 2. Hold the incentive spirometer in an upright position. 3. Breathe out normally. 4. Place the mouthpiece in your mouth and seal your lips tightly around it. 5. Breathe in slowly and as deeply as possible, raising the piston or the ball toward the top of the column. 6. Hold your breath for 3-5 seconds or for as long as possible. Allow the piston or ball to fall to the bottom of the column. 7. Remove the mouthpiece from your mouth and breathe out normally. 8. Rest for a few seconds and repeat Steps 1 through 7 at least 10 times every 1-2 hours when you are awake. Take your time and take a few normal breaths between deep breaths. 9. The spirometer may include an indicator to show your best effort. Use the indicator as a goal to work toward during each repetition. 10. After each set of 10 deep breaths, practice coughing to be sure your lungs are clear. If you have an incision (the cut made at the time of surgery), support your incision when coughing by placing a pillow or rolled up towels firmly against it. Once you are able to get out of bed, walk around indoors and cough well. You may stop using the incentive spirometer when instructed by your caregiver.  RISKS AND  COMPLICATIONS  Take your time so you do not get dizzy or light-headed.  If you are in pain, you may need to take or ask for pain medication before doing incentive spirometry. It is harder to take a deep breath if you are having pain. AFTER USE  Rest and breathe slowly and easily.  It can be helpful to keep track of a log of your progress. Your caregiver can provide you with a simple table to help with this. If you are using the spirometer at home, follow these instructions: [place] IF:   You are having difficultly using the spirometer.  You have trouble using the spirometer as often as instructed.  Your pain medication is not giving enough relief while using the spirometer.  You develop fever of 100.5 F (38.1 C) or higher. SEEK IMMEDIATE MEDICAL CARE IF:   You cough up bloody sputum that had not been present before.  You develop fever of 102 F (38.9 C) or greater.  You develop worsening pain at or near the incision site. MAKE SURE YOU:   Understand these instructions.  Will watch your condition.  Will get help right away if you are not doing well or get worse. Document Released: 03/15/2007 Document Revised: 01/25/2012 Document Reviewed: 05/16/2007 [facility] Patient Information 2014 [place], [place].   ________________________________________________________________________

## 2019-01-27 NOTE — Telephone Encounter (Signed)
RD called pt to offer Pre-Op nutrition information via either Pre-Op Class (this Monday 01/30/2019 @ 5:15pm) or phone call.   Pt is scheduled for bariatric surgery this Tuesday 01/31/2019 and has not had Pre-Op Class since 06/13/2018.  LVM

## 2019-01-27 NOTE — Telephone Encounter (Signed)
Pt returned RD's call and states he is able to attend Pre-Op Class on Monday 01/30/2019 at 5:15pm. RD will make sure pt is put on the class roster.

## 2019-01-30 ENCOUNTER — Other Ambulatory Visit: Payer: Self-pay

## 2019-01-30 ENCOUNTER — Encounter: Payer: BLUE CROSS/BLUE SHIELD | Attending: Surgery | Admitting: Skilled Nursing Facility1

## 2019-01-30 ENCOUNTER — Encounter (HOSPITAL_COMMUNITY): Payer: Self-pay

## 2019-01-30 ENCOUNTER — Encounter (HOSPITAL_COMMUNITY)
Admission: RE | Admit: 2019-01-30 | Discharge: 2019-01-30 | Disposition: A | Discharge status: Home / Self Care | Payer: BLUE CROSS/BLUE SHIELD | Source: Ambulatory Visit | Attending: Surgery | Admitting: Surgery

## 2019-01-30 DIAGNOSIS — E119 Type 2 diabetes mellitus without complications: Secondary | ICD-10-CM

## 2019-01-30 DIAGNOSIS — E7800 Pure hypercholesterolemia, unspecified: Secondary | ICD-10-CM | POA: Diagnosis not present

## 2019-01-30 DIAGNOSIS — Z7984 Long term (current) use of oral hypoglycemic drugs: Secondary | ICD-10-CM | POA: Diagnosis not present

## 2019-01-30 DIAGNOSIS — J302 Other seasonal allergic rhinitis: Secondary | ICD-10-CM | POA: Diagnosis not present

## 2019-01-30 DIAGNOSIS — E669 Obesity, unspecified: Secondary | ICD-10-CM | POA: Insufficient documentation

## 2019-01-30 DIAGNOSIS — Z87891 Personal history of nicotine dependence: Secondary | ICD-10-CM | POA: Diagnosis not present

## 2019-01-30 DIAGNOSIS — I10 Essential (primary) hypertension: Secondary | ICD-10-CM | POA: Diagnosis not present

## 2019-01-30 DIAGNOSIS — Z01818 Encounter for other preprocedural examination: Secondary | ICD-10-CM | POA: Insufficient documentation

## 2019-01-30 DIAGNOSIS — E8881 Metabolic syndrome: Secondary | ICD-10-CM

## 2019-01-30 DIAGNOSIS — E559 Vitamin D deficiency, unspecified: Secondary | ICD-10-CM | POA: Diagnosis not present

## 2019-01-30 DIAGNOSIS — G4700 Insomnia, unspecified: Secondary | ICD-10-CM | POA: Diagnosis not present

## 2019-01-30 DIAGNOSIS — Z79899 Other long term (current) drug therapy: Secondary | ICD-10-CM | POA: Diagnosis not present

## 2019-01-30 DIAGNOSIS — Z6841 Body Mass Index (BMI) 40.0 and over, adult: Secondary | ICD-10-CM | POA: Diagnosis not present

## 2019-01-30 DIAGNOSIS — K219 Gastro-esophageal reflux disease without esophagitis: Secondary | ICD-10-CM | POA: Diagnosis not present

## 2019-01-30 DIAGNOSIS — Z7982 Long term (current) use of aspirin: Secondary | ICD-10-CM | POA: Diagnosis not present

## 2019-01-30 HISTORY — DX: Type 2 diabetes mellitus without complications: E11.9

## 2019-01-30 HISTORY — DX: Headache, unspecified: R51.9

## 2019-01-30 HISTORY — DX: Pure hypercholesterolemia, unspecified: E78.00

## 2019-01-30 HISTORY — DX: Other seasonal allergic rhinitis: J30.2

## 2019-01-30 HISTORY — DX: Essential (primary) hypertension: I10

## 2019-01-30 HISTORY — DX: Headache: R51

## 2019-01-30 LAB — BASIC METABOLIC PANEL
Anion gap: 9 (ref 5–15)
BUN: 7 mg/dL (ref 6–20)
CO2: 23 mmol/L (ref 22–32)
Calcium: 9.5 mg/dL (ref 8.9–10.3)
Chloride: 106 mmol/L (ref 98–111)
Creatinine, Ser: 0.89 mg/dL (ref 0.61–1.24)
GFR calc Af Amer: >60 mL/min (ref >60)
GLUCOSE: 117 mg/dL — AB (ref 70–99)
Potassium: 4.2 mmol/L (ref 3.5–5.1)
Sodium: 138 mmol/L (ref 135–145)

## 2019-01-30 LAB — CBC
HCT: 39.3 % (ref 39.0–52.0)
Hemoglobin: 12.1 g/dL — ABNORMAL LOW (ref 13.0–17.0)
MCH: 24.3 pg — ABNORMAL LOW (ref 26.0–34.0)
MCHC: 30.8 g/dL (ref 30.0–36.0)
MCV: 79.1 fL — ABNORMAL LOW (ref 80.0–100.0)
NRBC: 0 % (ref 0.0–0.2)
Platelets: 340 10*3/uL (ref 150–400)
RBC: 4.97 MIL/uL (ref 4.22–5.81)
RDW: 14.9 % (ref 11.5–15.5)
WBC: 6.3 10*3/uL (ref 4.0–10.5)

## 2019-01-30 LAB — COMPREHENSIVE METABOLIC PANEL
ALBUMIN: 4.6 g/dL (ref 3.5–5.0)
ALT: 77 U/L — ABNORMAL HIGH (ref 0–44)
AST: 51 U/L — ABNORMAL HIGH (ref 15–41)
Alkaline Phosphatase: 63 U/L (ref 38–126)
Anion gap: 11 (ref 5–15)
BUN: 7 mg/dL (ref 6–20)
CO2: 22 mmol/L (ref 22–32)
Calcium: 9.6 mg/dL (ref 8.9–10.3)
Chloride: 107 mmol/L (ref 98–111)
Creatinine, Ser: 0.83 mg/dL (ref 0.61–1.24)
GFR calc Af Amer: >60 mL/min (ref >60)
GFR calc non Af Amer: >60 mL/min (ref >60)
GLUCOSE: 113 mg/dL — AB (ref 70–99)
Potassium: 4.3 mmol/L (ref 3.5–5.1)
Sodium: 140 mmol/L (ref 135–145)
Total Bilirubin: 0.4 mg/dL (ref 0.3–1.2)
Total Protein: 7.8 g/dL (ref 6.5–8.1)

## 2019-01-30 LAB — HEMOGLOBIN A1C
Hgb A1c MFr Bld: 7.3 % — ABNORMAL HIGH (ref 4.8–5.6)
Mean Plasma Glucose: 162.81 mg/dL

## 2019-01-30 LAB — ABO/RH: ABO/RH(D): B NEG

## 2019-01-30 LAB — DIFFERENTIAL
Basophils Absolute: 0 10*3/uL (ref 0.0–0.1)
Basophils Relative: 0 %
Eosinophils Absolute: 0.1 10*3/uL (ref 0.0–0.5)
Eosinophils Relative: 1 %
Lymphocytes Relative: 37 %
Lymphs Abs: 2.4 10*3/uL (ref 0.7–4.0)
MONOS PCT: 6 %
Monocytes Absolute: 0.4 10*3/uL (ref 0.1–1.0)
Neutro Abs: 3.6 10*3/uL (ref 1.7–7.7)
Neutrophils Relative %: 55 %

## 2019-01-30 LAB — GLUCOSE, CAPILLARY: Glucose-Capillary: 106 mg/dL — ABNORMAL HIGH (ref 70–99)

## 2019-01-30 NOTE — Progress Notes (Signed)
Pre-Operative Nutrition Class:  Appt start time: 5520   End time:  1830.  Patient was seen on 01/30/2019 for Pre-Operative Bariatric Surgery Education [facility].   Surgery date: 01/31/2019 Surgery type: RYGB Start weight at [facility]: 237.7 Weight today: 239  Samples given per MNT protocol. Patient educated on appropriate usage: Bariatric Advantage Multivitamin Lot # [id number] Exp:04/21  Bariatric Advantage Calcium  Lot # [id number] Exp: February 27, 2019  Premier Protein Powder  Shake Lot # [id number] Exp: February 20, 2019 The following the learning objectives were met by the patient during this course:  Identify Pre-Op Dietary Goals and will begin 2 weeks pre-operatively  Identify appropriate sources of fluids and proteins   State protein recommendations and appropriate sources pre and post-operatively  Identify Post-Operative Dietary Goals and will follow for 2 weeks post-operatively  Identify appropriate multivitamin and calcium sources  Describe the need for physical activity post-operatively and will follow MD recommendations  State when to call healthcare provider regarding medication questions or post-operative complications  Handouts given during class include:  Pre-Op Bariatric Surgery Diet Handout  Protein Shake Handout  Post-Op Bariatric Surgery Nutrition Handout  BELT Program Information Flyer  Support Group Information Flyer  [facility] Bariatric Supplements Price List  Follow-Up Plan: Patient will follow-up at [facility] 2 weeks post operatively for diet advancement per MD.

## 2019-01-30 NOTE — Anesthesia Preprocedure Evaluation (Addendum)
Anesthesia Evaluation  Patient identified by MRN, date of birth, ID band Patient awake    Reviewed: Allergy & Precautions, NPO status , Patient's Chart, lab work & pertinent test results  History of Anesthesia Complications Negative for: history of anesthetic complications  Airway Mallampati: IV  TM Distance: >3 FB Neck ROM: Full    Dental  (+) Teeth Intact, Dental Advisory Given   Pulmonary neg pulmonary ROS,    Pulmonary exam normal breath sounds clear to auscultation       Cardiovascular hypertension, Pt. on medications Normal cardiovascular exam Rhythm:Regular Rate:Normal     Neuro/Psych negative neurological ROS     GI/Hepatic Neg liver ROS, GERD  Medicated,  Endo/Other  diabetes, Type 2, Oral Hypoglycemic AgentsMorbid obesity  Renal/GU negative Renal ROS     Musculoskeletal negative musculoskeletal ROS (+)   Abdominal   Peds  Hematology negative hematology ROS (+)   Anesthesia Other Findings Day of surgery medications reviewed with the patient.  Reproductive/Obstetrics                            Anesthesia Physical Anesthesia Plan  ASA: III  Anesthesia Plan: General   Post-op Pain Management:    Induction: Intravenous  PONV Risk Score and Plan: 3 and Treatment may vary due to age or medical condition, Ondansetron, Dexamethasone and Midazolam  Airway Management Planned: Oral ETT  Additional Equipment:   Intra-op Plan:   Post-operative Plan: Extubation in OR  Informed Consent: I have reviewed the patients History and Physical, chart, labs and discussed the procedure including the risks, benefits and alternatives for the proposed anesthesia with the patient or authorized representative who has indicated his/her understanding and acceptance.     Dental advisory given  Plan Discussed with: CRNA  Anesthesia Plan Comments:        Anesthesia Quick Evaluation

## 2019-01-30 NOTE — H&P (Signed)
Chief Complaint:  Metabolic syndrome  History of Present Illness:  Mark Lang is an 48 y.o. male referred by Dr. Gwendel Hanson who desires a roux en Y gastric bypass.  His wife had a gastric bypass ~8 years ago and he is very familiar with it.  He has metabolic syndrome and GER.    Past Medical History:  Diagnosis Date  . Diabetes mellitus, type 2 ([place])   . GERD (gastroesophageal reflux disease)   . Headache   . HTN (hypertension)    dr Einar Gip  cardiologist , reports he may have had a chemical stress test done   . Hypercholesteremia   . Insomnia   . Morbid obesity ([place])   . Neoplasm of tongue   . Seasonal allergies   . Vitamin D deficiency     Past Surgical History:  Procedure Laterality Date  . UPPER GI ENDOSCOPY    . WISDOM TOOTH EXTRACTION      No current facility-administered medications for this encounter.    Current Outpatient Medications  Medication Sig Dispense Refill  . Ascorbic Acid (VITAMIN C) 1000 MG tablet Take 2,000 mg by mouth daily.    . ASHWAGANDHA PO Take 1,600 mg by mouth at bedtime.    Marland Kitchen aspirin EC 81 MG tablet Take 81 mg by mouth daily.    . cimetidine (TAGAMET) 200 MG tablet Take 200 mg by mouth at bedtime as needed (heart burn).    . Cyanocobalamin (VITAMIN B-12 PO) Take 5,000 mcg by mouth daily.    . diphenhydramine-acetaminophen (TYLENOL PM) 25-500 MG TABS tablet Take 2 tablets by mouth at bedtime as needed (slee).    . famotidine (PEPCID) 20 MG tablet Take 40 mg by mouth daily.    . fexofenadine (ALLEGRA) 180 MG tablet Take 180 mg by mouth daily.    . metFORMIN (GLUCOPHAGE) 1000 MG tablet Take 1,000 mg by mouth 2 (two) times daily with a meal.    . metoprolol succinate (TOPROL-XL) 25 MG 24 hr tablet Take 25 mg by mouth daily.    . Omega-3 Fatty Acids (FISH OIL PO) Take 2 capsules by mouth daily.    Marland Kitchen omeprazole (PRILOSEC) 40 MG capsule Take 40 mg by mouth 2 (two) times daily.    . pravastatin (PRAVACHOL) 80 MG tablet Take 80 mg by mouth at bedtime.    .  simethicone (MYLICON) 829 MG chewable tablet Chew 375 mg by mouth 3 (three) times daily as needed for flatulence.    . SUPER B COMPLEX/C PO Take 2 tablets by mouth daily.    . Vitamin D, Ergocalciferol, (DRISDOL) 1.25 MG (50000 UT) CAPS capsule Take 50,000 Units by mouth every Saturday.     Patient has no known allergies. No family history on file. Social History:   reports that he has never smoked. He quit smokeless tobacco use about 3 months ago.  His smokeless tobacco use included chew. He reports current alcohol use. No history on file for drug.   REVIEW OF SYSTEMS : Negative except for see problem list  Physical Exam:   There were no vitals taken for this visit. There is no height or weight on file to calculate BMI.  Gen:  WDWN male NAD  Neurological: Alert and oriented to person, place, and time. Motor and sensory function is grossly intact  Head: Normocephalic and atraumatic.  Eyes: Conjunctivae are normal. Pupils are equal, round, and reactive to light. No scleral icterus.  Neck: Normal range of motion. Neck supple. No tracheal deviation or  thyromegaly present.  Cardiovascular:  SR without murmurs or gallops.  No carotid bruits Breast:  Not examined Respiratory: Effort normal.  No respiratory distress. No chest wall tenderness. Breath sounds normal.  No wheezes, rales or rhonchi.  Abdomen:  Protuberant with increased girth GU:  unremarkable Musculoskeletal: Normal range of motion. Extremities are nontender. No cyanosis, edema or clubbing noted Lymphadenopathy: No cervical, preauricular, postauricular or axillary adenopathy is present Skin: Skin is warm and dry. No rash noted. No diaphoresis. No erythema. No pallor. Pscyh: Normal mood and affect. Behavior is normal. Judgment and thought content normal.   LABORATORY RESULTS: Results for orders placed or performed during the hospital encounter of 01/30/19 (from the past 48 hour(s))  Glucose, capillary     Status: Abnormal    Collection Time: 01/30/19 10:08 AM  Result Value Ref Range   Glucose-Capillary 106 (H) 70 - 99 mg/dL  Basic metabolic panel     Status: Abnormal   Collection Time: 01/30/19 10:47 AM  Result Value Ref Range   Sodium 138 135 - 145 mmol/L   Potassium 4.2 3.5 - 5.1 mmol/L   Chloride 106 98 - 111 mmol/L   CO2 23 22 - 32 mmol/L   Glucose, Bld 117 (H) 70 - 99 mg/dL   BUN 7 6 - 20 mg/dL   Creatinine, Ser 0.89 0.61 - 1.24 mg/dL   Calcium 9.5 8.9 - 10.3 mg/dL   GFR calc non Af Amer >60 >60 mL/min   GFR calc Af Amer >60 >60 mL/min   Anion gap 9 5 - 15    Comment: Performed at [facility], [place] [address] [place], [place] [address]  Hemoglobin A1c     Status: Abnormal   Collection Time: 01/30/19 10:47 AM  Result Value Ref Range   Hgb A1c MFr Bld 7.3 (H) 4.8 - 5.6 %    Comment: (NOTE) Pre diabetes:          5.7%-6.4% Diabetes:              >6.4% Glycemic control for   <7.0% adults with diabetes    Mean Plasma Glucose 162.81 mg/dL    Comment: Performed at [place] [address], [place], [place] [address]  CBC     Status: Abnormal   Collection Time: 01/30/19 10:47 AM  Result Value Ref Range   WBC 6.3 4.0 - 10.5 K/uL   RBC 4.97 4.22 - 5.81 MIL/uL   Hemoglobin 12.1 (L) 13.0 - 17.0 g/dL   HCT 39.3 39.0 - 52.0 %   MCV 79.1 (L) 80.0 - 100.0 fL   MCH 24.3 (L) 26.0 - 34.0 pg   MCHC 30.8 30.0 - 36.0 g/dL   RDW 14.9 11.5 - 15.5 %   Platelets 340 150 - 400 K/uL   nRBC 0.0 0.0 - 0.2 %    Comment: Performed at [facility], [place] [address], [place], [place] [address]     RADIOLOGY RESULTS: No results found.  Problem List: Patient Active Problem List   Diagnosis Date Noted  . 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 Hassell Done, MD, [facility] [phone] beeper [phone]  01/30/2019 1:08 PM

## 2019-01-31 ENCOUNTER — Encounter (HOSPITAL_COMMUNITY): Payer: Self-pay

## 2019-01-31 ENCOUNTER — Inpatient Hospital Stay (HOSPITAL_COMMUNITY)
Admission: RE | Admit: 2019-01-31 | Discharge: 2019-02-01 | DRG: 620 | Disposition: A | Discharge status: Home / Self Care | Payer: BLUE CROSS/BLUE SHIELD | Attending: Surgery | Admitting: Surgery

## 2019-01-31 ENCOUNTER — Encounter (HOSPITAL_COMMUNITY)
Admission: RE | Disposition: A | Discharge status: Home / Self Care | Payer: Self-pay | Source: Home / Self Care | Attending: Surgery

## 2019-01-31 ENCOUNTER — Inpatient Hospital Stay (HOSPITAL_COMMUNITY): Payer: BLUE CROSS/BLUE SHIELD | Admitting: Anesthesiology

## 2019-01-31 ENCOUNTER — Inpatient Hospital Stay (HOSPITAL_COMMUNITY): Payer: BLUE CROSS/BLUE SHIELD | Admitting: Physician Assistant

## 2019-01-31 ENCOUNTER — Other Ambulatory Visit: Payer: Self-pay

## 2019-01-31 DIAGNOSIS — Z7982 Long term (current) use of aspirin: Secondary | ICD-10-CM

## 2019-01-31 DIAGNOSIS — K219 Gastro-esophageal reflux disease without esophagitis: Secondary | ICD-10-CM | POA: Diagnosis present

## 2019-01-31 DIAGNOSIS — G4700 Insomnia, unspecified: Secondary | ICD-10-CM | POA: Diagnosis present

## 2019-01-31 DIAGNOSIS — I10 Essential (primary) hypertension: Secondary | ICD-10-CM | POA: Diagnosis present

## 2019-01-31 DIAGNOSIS — E8881 Metabolic syndrome: Secondary | ICD-10-CM | POA: Diagnosis present

## 2019-01-31 DIAGNOSIS — Z6841 Body Mass Index (BMI) 40.0 and over, adult: Secondary | ICD-10-CM | POA: Diagnosis not present

## 2019-01-31 DIAGNOSIS — Z79899 Other long term (current) drug therapy: Secondary | ICD-10-CM | POA: Diagnosis not present

## 2019-01-31 DIAGNOSIS — Z7984 Long term (current) use of oral hypoglycemic drugs: Secondary | ICD-10-CM | POA: Diagnosis not present

## 2019-01-31 DIAGNOSIS — J302 Other seasonal allergic rhinitis: Secondary | ICD-10-CM | POA: Diagnosis present

## 2019-01-31 DIAGNOSIS — Z87891 Personal history of nicotine dependence: Secondary | ICD-10-CM | POA: Diagnosis not present

## 2019-01-31 DIAGNOSIS — E119 Type 2 diabetes mellitus without complications: Secondary | ICD-10-CM | POA: Diagnosis present

## 2019-01-31 DIAGNOSIS — E559 Vitamin D deficiency, unspecified: Secondary | ICD-10-CM | POA: Diagnosis present

## 2019-01-31 DIAGNOSIS — E7800 Pure hypercholesterolemia, unspecified: Secondary | ICD-10-CM | POA: Diagnosis present

## 2019-01-31 DIAGNOSIS — Z9884 Bariatric surgery status: Secondary | ICD-10-CM

## 2019-01-31 HISTORY — PX: GASTRIC ROUX-EN-Y: SHX5262

## 2019-01-31 LAB — TYPE AND SCREEN
ABO/RH(D): B NEG
Antibody Screen: NEGATIVE

## 2019-01-31 LAB — HEMOGLOBIN AND HEMATOCRIT, BLOOD
HCT: 38.5 % — ABNORMAL LOW (ref 39.0–52.0)
Hemoglobin: 11.5 g/dL — ABNORMAL LOW (ref 13.0–17.0)

## 2019-01-31 LAB — GLUCOSE, CAPILLARY
Glucose-Capillary: 108 mg/dL — ABNORMAL HIGH (ref 70–99)
Glucose-Capillary: 169 mg/dL — ABNORMAL HIGH (ref 70–99)
Glucose-Capillary: 133 mg/dL — ABNORMAL HIGH (ref 70–99)
Glucose-Capillary: 128 mg/dL — ABNORMAL HIGH (ref 70–99)
Glucose-Capillary: 114 mg/dL — ABNORMAL HIGH (ref 70–99)

## 2019-01-31 SURGERY — LAPAROSCOPIC ROUX-EN-Y GASTRIC BYPASS WITH UPPER ENDOSCOPY
Anesthesia: General | Site: Abdomen

## 2019-01-31 MED ORDER — SUGAMMADEX SODIUM 200 MG/2ML IV SOLN
INTRAVENOUS | Status: DC | PRN
  Administered 2019-01-31: 220 mg via INTRAVENOUS

## 2019-01-31 MED ORDER — FENTANYL CITRATE (PF) 100 MCG/2ML IJ SOLN
INTRAMUSCULAR | Status: AC
  Administered 2019-01-31: 50 ug via INTRAVENOUS
  Filled 2019-01-31: qty 2

## 2019-01-31 MED ORDER — PROPOFOL 10 MG/ML IV BOLUS
INTRAVENOUS | Status: AC
  Filled 2019-01-31: qty 40

## 2019-01-31 MED ORDER — KETAMINE HCL 10 MG/ML IJ SOLN
INTRAMUSCULAR | Status: DC | PRN
  Administered 2019-01-31: 30 mg via INTRAVENOUS

## 2019-01-31 MED ORDER — ACETAMINOPHEN 10 MG/ML IV SOLN
1000.0000 mg | Freq: Once | INTRAVENOUS | Status: DC | PRN
  Administered 2019-01-31: 1000 mg via INTRAVENOUS

## 2019-01-31 MED ORDER — LIDOCAINE 2% (20 MG/ML) 5 ML SYRINGE
INTRAMUSCULAR | Status: DC | PRN
  Administered 2019-01-31: 100 mg via INTRAVENOUS

## 2019-01-31 MED ORDER — LACTATED RINGERS IR SOLN
Status: DC | PRN
  Administered 2019-01-31: 1

## 2019-01-31 MED ORDER — PROMETHAZINE HCL 25 MG/ML IJ SOLN: 6.2500 mg | INTRAMUSCULAR | Status: DC | PRN

## 2019-01-31 MED ORDER — SODIUM CHLORIDE 0.9 % IV SOLN
2.0000 g | INTRAVENOUS | Status: AC
  Administered 2019-01-31: 2 g via INTRAVENOUS
  Filled 2019-01-31: qty 2

## 2019-01-31 MED ORDER — ROCURONIUM BROMIDE 10 MG/ML (PF) SYRINGE
PREFILLED_SYRINGE | INTRAVENOUS | Status: DC | PRN
  Administered 2019-01-31: 10 mg via INTRAVENOUS
  Administered 2019-01-31: 30 mg via INTRAVENOUS
  Administered 2019-01-31 (×3): 10 mg via INTRAVENOUS
  Administered 2019-01-31 (×2): 20 mg via INTRAVENOUS

## 2019-01-31 MED ORDER — ONDANSETRON HCL 4 MG/2ML IJ SOLN
INTRAMUSCULAR | Status: DC | PRN
  Administered 2019-01-31: 4 mg via INTRAVENOUS

## 2019-01-31 MED ORDER — SUCCINYLCHOLINE CHLORIDE 200 MG/10ML IV SOSY
PREFILLED_SYRINGE | INTRAVENOUS | Status: DC | PRN
  Administered 2019-01-31: 140 mg via INTRAVENOUS

## 2019-01-31 MED ORDER — LIDOCAINE HCL 2 % IJ SOLN
INTRAMUSCULAR | Status: AC
  Filled 2019-01-31: qty 40

## 2019-01-31 MED ORDER — PHENYLEPHRINE 40 MCG/ML (10ML) SYRINGE FOR IV PUSH (FOR BLOOD PRESSURE SUPPORT)
PREFILLED_SYRINGE | INTRAVENOUS | Status: DC | PRN
  Administered 2019-01-31 (×2): 80 ug via INTRAVENOUS
  Administered 2019-01-31: 40 ug via INTRAVENOUS
  Administered 2019-01-31: 80 ug via INTRAVENOUS
  Administered 2019-01-31: 40 ug via INTRAVENOUS
  Administered 2019-01-31 (×3): 80 ug via INTRAVENOUS
  Administered 2019-01-31: 40 ug via INTRAVENOUS
  Administered 2019-01-31 (×5): 80 ug via INTRAVENOUS
  Administered 2019-01-31 (×2): 40 ug via INTRAVENOUS

## 2019-01-31 MED ORDER — ACETAMINOPHEN 10 MG/ML IV SOLN
INTRAVENOUS | Status: AC
  Administered 2019-01-31: 1000 mg via INTRAVENOUS
  Filled 2019-01-31: qty 100

## 2019-01-31 MED ORDER — METOPROLOL TARTRATE 5 MG/5ML IV SOLN: 5.0000 mg | Freq: Four times a day (QID) | INTRAVENOUS | Status: DC | PRN

## 2019-01-31 MED ORDER — HEPARIN SODIUM (PORCINE) 5000 UNIT/ML IJ SOLN
5000.0000 [IU] | INTRAMUSCULAR | Status: AC
  Administered 2019-01-31: 5000 [IU] via SUBCUTANEOUS
  Filled 2019-01-31: qty 1

## 2019-01-31 MED ORDER — SODIUM CHLORIDE (PF) 0.9 % IJ SOLN
INTRAMUSCULAR | Status: DC | PRN
  Administered 2019-01-31: 10 mL via INTRAVENOUS

## 2019-01-31 MED ORDER — DEXAMETHASONE SODIUM PHOSPHATE 10 MG/ML IJ SOLN
INTRAMUSCULAR | Status: AC
  Filled 2019-01-31: qty 1

## 2019-01-31 MED ORDER — FENTANYL CITRATE (PF) 250 MCG/5ML IJ SOLN
INTRAMUSCULAR | Status: DC | PRN
  Administered 2019-01-31: 100 ug via INTRAVENOUS

## 2019-01-31 MED ORDER — TISSEEL VH 10 ML EX KIT
PACK | CUTANEOUS | Status: DC | PRN
  Administered 2019-01-31: 10 mL

## 2019-01-31 MED ORDER — DEXAMETHASONE SODIUM PHOSPHATE 10 MG/ML IJ SOLN
INTRAMUSCULAR | Status: DC | PRN
  Administered 2019-01-31: 4 mg via INTRAVENOUS

## 2019-01-31 MED ORDER — ONDANSETRON HCL 4 MG/2ML IJ SOLN: 4.0000 mg | INTRAMUSCULAR | Status: DC | PRN

## 2019-01-31 MED ORDER — SUGAMMADEX SODIUM 500 MG/5ML IV SOLN
INTRAVENOUS | Status: AC
  Filled 2019-01-31: qty 5

## 2019-01-31 MED ORDER — LIDOCAINE 2% (20 MG/ML) 5 ML SYRINGE
INTRAMUSCULAR | Status: AC
  Filled 2019-01-31: qty 5

## 2019-01-31 MED ORDER — FENTANYL CITRATE (PF) 100 MCG/2ML IJ SOLN
25.0000 ug | INTRAMUSCULAR | Status: DC | PRN
  Administered 2019-01-31 (×2): 50 ug via INTRAVENOUS

## 2019-01-31 MED ORDER — OXYCODONE HCL 5 MG/5ML PO SOLN
5.0000 mg | ORAL | Status: DC | PRN
  Administered 2019-02-01: 10 mg via ORAL
  Filled 2019-01-31: qty 10

## 2019-01-31 MED ORDER — METOPROLOL SUCCINATE ER 25 MG PO TB24
25.0000 mg | ORAL_TABLET | Freq: Every day | ORAL | Status: DC
  Administered 2019-02-01: 25 mg via ORAL
  Filled 2019-01-31: qty 1

## 2019-01-31 MED ORDER — CHLORHEXIDINE GLUCONATE CLOTH 2 % EX PADS: 6.0000 | MEDICATED_PAD | Freq: Once | CUTANEOUS | Status: DC

## 2019-01-31 MED ORDER — APREPITANT 40 MG PO CAPS
40.0000 mg | ORAL_CAPSULE | ORAL | Status: AC
  Administered 2019-01-31: 40 mg via ORAL
  Filled 2019-01-31: qty 1

## 2019-01-31 MED ORDER — GABAPENTIN 300 MG PO CAPS
300.0000 mg | ORAL_CAPSULE | ORAL | Status: AC
  Administered 2019-01-31: 300 mg via ORAL
  Filled 2019-01-31: qty 1

## 2019-01-31 MED ORDER — ROCURONIUM BROMIDE 10 MG/ML (PF) SYRINGE
PREFILLED_SYRINGE | INTRAVENOUS | Status: AC
  Filled 2019-01-31: qty 10

## 2019-01-31 MED ORDER — SUGAMMADEX SODIUM 200 MG/2ML IV SOLN
INTRAVENOUS | Status: AC
  Filled 2019-01-31: qty 2

## 2019-01-31 MED ORDER — LACTATED RINGERS IV SOLN
INTRAVENOUS | Status: DC
  Administered 2019-01-31: 06:00:00 via INTRAVENOUS

## 2019-01-31 MED ORDER — KCL IN DEXTROSE-NACL 20-5-0.45 MEQ/L-%-% IV SOLN
INTRAVENOUS | Status: DC
  Administered 2019-01-31 – 2019-02-01 (×3): via INTRAVENOUS
  Filled 2019-01-31 (×3): qty 1000

## 2019-01-31 MED ORDER — HEPARIN SODIUM (PORCINE) 5000 UNIT/ML IJ SOLN
5000.0000 [IU] | Freq: Three times a day (TID) | INTRAMUSCULAR | Status: DC
  Administered 2019-01-31 – 2019-02-01 (×4): 5000 [IU] via SUBCUTANEOUS
  Filled 2019-01-31 (×4): qty 1

## 2019-01-31 MED ORDER — SODIUM CHLORIDE (PF) 0.9 % IJ SOLN
INTRAMUSCULAR | Status: AC
  Filled 2019-01-31: qty 10

## 2019-01-31 MED ORDER — ENSURE MAX PROTEIN PO LIQD
2.0000 [oz_av] | ORAL | Status: DC
  Administered 2019-02-01 (×4): 2 [oz_av] via ORAL

## 2019-01-31 MED ORDER — KETAMINE HCL 10 MG/ML IJ SOLN
INTRAMUSCULAR | Status: AC
  Filled 2019-01-31: qty 1

## 2019-01-31 MED ORDER — FENTANYL CITRATE (PF) 100 MCG/2ML IJ SOLN
INTRAMUSCULAR | Status: AC
  Filled 2019-01-31: qty 2

## 2019-01-31 MED ORDER — INSULIN ASPART 100 UNIT/ML ~~LOC~~ SOLN
0.0000 [IU] | SUBCUTANEOUS | Status: DC
  Administered 2019-01-31 – 2019-02-01 (×3): 3 [IU] via SUBCUTANEOUS

## 2019-01-31 MED ORDER — MIDAZOLAM HCL 2 MG/2ML IJ SOLN
INTRAMUSCULAR | Status: DC | PRN
  Administered 2019-01-31: 2 mg via INTRAVENOUS

## 2019-01-31 MED ORDER — PROPOFOL 10 MG/ML IV BOLUS
INTRAVENOUS | Status: DC | PRN
  Administered 2019-01-31: 20 mg via INTRAVENOUS

## 2019-01-31 MED ORDER — PANTOPRAZOLE SODIUM 40 MG IV SOLR
40.0000 mg | Freq: Every day | INTRAVENOUS | Status: DC
  Administered 2019-01-31: 40 mg via INTRAVENOUS
  Filled 2019-01-31: qty 40

## 2019-01-31 MED ORDER — ACETAMINOPHEN 160 MG/5ML PO SOLN
650.0000 mg | Freq: Four times a day (QID) | ORAL | Status: DC
  Administered 2019-01-31 – 2019-02-01 (×3): 650 mg via ORAL
  Filled 2019-01-31 (×3): qty 20.3

## 2019-01-31 MED ORDER — MIDAZOLAM HCL 2 MG/2ML IJ SOLN
INTRAMUSCULAR | Status: AC
  Filled 2019-01-31: qty 2

## 2019-01-31 MED ORDER — SCOPOLAMINE 1 MG/3DAYS TD PT72
1.0000 | MEDICATED_PATCH | TRANSDERMAL | Status: DC
  Administered 2019-01-31: 1.5 mg via TRANSDERMAL
  Filled 2019-01-31: qty 1

## 2019-01-31 MED ORDER — BUPIVACAINE LIPOSOME 1.3 % IJ SUSP
INTRAMUSCULAR | Status: DC | PRN
  Administered 2019-01-31: 20 mL

## 2019-01-31 MED ORDER — TISSEEL VH 10 ML EX KIT
PACK | CUTANEOUS | Status: AC
  Filled 2019-01-31: qty 1

## 2019-01-31 MED ORDER — MORPHINE SULFATE (PF) 2 MG/ML IV SOLN
1.0000 mg | INTRAVENOUS | Status: DC | PRN
  Administered 2019-01-31: 2 mg via INTRAVENOUS
  Filled 2019-01-31: qty 1

## 2019-01-31 MED ORDER — 0.9 % SODIUM CHLORIDE (POUR BTL) OPTIME
TOPICAL | Status: DC | PRN
  Administered 2019-01-31: 1000 mL

## 2019-01-31 MED ORDER — ONDANSETRON HCL 4 MG/2ML IJ SOLN
INTRAMUSCULAR | Status: AC
  Filled 2019-01-31: qty 2

## 2019-01-31 MED ORDER — BUPIVACAINE LIPOSOME 1.3 % IJ SUSP
20.0000 mL | Freq: Once | INTRAMUSCULAR | Status: DC
  Filled 2019-01-31: qty 20

## 2019-01-31 SURGICAL SUPPLY — 73 items
APPLICATOR COTTON TIP 6 STRL (MISCELLANEOUS) ×2 IMPLANT
APPLICATOR COTTON TIP 6IN STRL (MISCELLANEOUS) ×6
APPLIER CLIP ROT 10 11.4 M/L (STAPLE)
APPLIER CLIP ROT 13.4 12 LRG (CLIP)
BENZOIN TINCTURE PRP APPL 2/3 (GAUZE/BANDAGES/DRESSINGS) IMPLANT
BLADE SURG 15 STRL LF DISP TIS (BLADE) ×1 IMPLANT
BLADE SURG 15 STRL SS (BLADE) ×2
CABLE HIGH FREQUENCY MONO STRZ (ELECTRODE) IMPLANT
CLIP APPLIE ROT 10 11.4 M/L (STAPLE) IMPLANT
CLIP APPLIE ROT 13.4 12 LRG (CLIP) IMPLANT
CLIP SUT LAPRA TY ABSORB (SUTURE) ×3 IMPLANT
CLOSURE WOUND 1/2 X4 (GAUZE/BANDAGES/DRESSINGS)
COVER WAND RF STERILE (DRAPES) IMPLANT
DERMABOND ADVANCED (GAUZE/BANDAGES/DRESSINGS) ×2
DERMABOND ADVANCED .7 DNX12 (GAUZE/BANDAGES/DRESSINGS) ×1 IMPLANT
DEVICE SUT QUICK LOAD TK 5 (STAPLE) IMPLANT
DEVICE SUT TI-KNOT TK 5X26 (MISCELLANEOUS) IMPLANT
DEVICE SUTURE ENDOST 10MM (ENDOMECHANICALS) ×3 IMPLANT
DEVICE TI KNOT TK5 (MISCELLANEOUS)
DISSECTOR BLUNT TIP ENDO 5MM (MISCELLANEOUS) IMPLANT
DRAIN PENROSE 18X1/4 LTX STRL (WOUND CARE) ×3 IMPLANT
GAUZE 4X4 16PLY RFD (DISPOSABLE) ×3 IMPLANT
GAUZE SPONGE 4X4 12PLY STRL (GAUZE/BANDAGES/DRESSINGS) IMPLANT
GLOVE BIOGEL M 8.0 STRL (GLOVE) ×3 IMPLANT
GOWN STRL REUS W/TWL XL LVL3 (GOWN DISPOSABLE) ×12 IMPLANT
HANDLE STAPLE EGIA 4 XL (STAPLE) ×3 IMPLANT
HOVERMATT SINGLE USE (MISCELLANEOUS) ×3 IMPLANT
KIT BASIN OR (CUSTOM PROCEDURE TRAY) ×3 IMPLANT
KIT GASTRIC LAVAGE 34FR ADT (SET/KITS/TRAYS/PACK) ×3 IMPLANT
KIT TURNOVER KIT A (KITS) IMPLANT
MARKER SKIN DUAL TIP RULER LAB (MISCELLANEOUS) ×3 IMPLANT
NEEDLE SPNL 22GX3.5 QUINCKE BK (NEEDLE) ×3 IMPLANT
PACK CARDIOVASCULAR III (CUSTOM PROCEDURE TRAY) ×3 IMPLANT
POUCH SPECIMEN RETRIEVAL 10MM (ENDOMECHANICALS) ×3 IMPLANT
QUICK LOAD TK 5 (STAPLE)
RELOAD EGIA 45 MED/THCK PURPLE (STAPLE) ×3 IMPLANT
RELOAD EGIA 45 TAN VASC (STAPLE) IMPLANT
RELOAD EGIA 60 MED/THCK PURPLE (STAPLE) ×6 IMPLANT
RELOAD EGIA 60 TAN VASC (STAPLE) ×9 IMPLANT
RELOAD ENDO STITCH 2.0 (ENDOMECHANICALS) ×18
RELOAD TRI 45 ART MED THCK PUR (STAPLE) ×3 IMPLANT
RELOAD TRI 60 ART MED THCK PUR (STAPLE) ×6 IMPLANT
SCISSORS LAP 5X45 EPIX DISP (ENDOMECHANICALS) ×3 IMPLANT
SEALANT SURGICAL APPL DUAL CAN (MISCELLANEOUS) ×3 IMPLANT
SET IRRIG TUBING LAPAROSCOPIC (IRRIGATION / IRRIGATOR) ×3 IMPLANT
SET TUBE SMOKE EVAC HIGH FLOW (TUBING) ×3 IMPLANT
SHEARS HARMONIC ACE PLUS 45CM (MISCELLANEOUS) ×3 IMPLANT
SLEEVE ADV FIXATION 12X100MM (TROCAR) ×6 IMPLANT
SLEEVE ADV FIXATION 5X100MM (TROCAR) IMPLANT
SOLUTION ANTI FOG 6CC (MISCELLANEOUS) ×3 IMPLANT
STAPLER VISISTAT 35W (STAPLE) ×3 IMPLANT
STRIP CLOSURE SKIN 1/2X4 (GAUZE/BANDAGES/DRESSINGS) IMPLANT
SUT MNCRL AB 4-0 PS2 18 (SUTURE) ×9 IMPLANT
SUT RELOAD ENDO STITCH 2 48X1 (ENDOMECHANICALS) ×5
SUT RELOAD ENDO STITCH 2.0 (ENDOMECHANICALS) ×4
SUT SURGIDAC NAB ES-9 0 48 120 (SUTURE) IMPLANT
SUT VIC AB 2-0 SH 27 (SUTURE) ×4
SUT VIC AB 2-0 SH 27X BRD (SUTURE) ×2 IMPLANT
SUT VIC AB 4-0 SH 18 (SUTURE) ×3 IMPLANT
SUTURE RELOAD END STTCH 2 48X1 (ENDOMECHANICALS) ×5 IMPLANT
SUTURE RELOAD ENDO STITCH 2.0 (ENDOMECHANICALS) ×4 IMPLANT
SYR 10ML ECCENTRIC (SYRINGE) ×3 IMPLANT
SYR 20CC LL (SYRINGE) ×6 IMPLANT
SYR 50ML LL SCALE MARK (SYRINGE) ×3 IMPLANT
TOWEL OR 17X26 10 PK STRL BLUE (TOWEL DISPOSABLE) ×6 IMPLANT
TOWEL OR NON WOVEN STRL DISP B (DISPOSABLE) ×3 IMPLANT
TRAY FOLEY MTR SLVR 16FR STAT (SET/KITS/TRAYS/PACK) ×3 IMPLANT
TROCAR ADV FIXATION 12X100MM (TROCAR) ×3 IMPLANT
TROCAR ADV FIXATION 5X100MM (TROCAR) ×3 IMPLANT
TROCAR BLADELESS OPT 5 100 (ENDOMECHANICALS) ×3 IMPLANT
TROCAR XCEL 12X100 BLDLESS (ENDOMECHANICALS) ×3 IMPLANT
TUBING CONNECTING 10 (TUBING) ×4 IMPLANT
TUBING CONNECTING 10' (TUBING) ×2

## 2019-01-31 NOTE — Anesthesia Postprocedure Evaluation (Signed)
Anesthesia Post Note  Patient: Yader Criger  Procedure(s) Performed: LAPAROSCOPIC ROUX-EN-Y GASTRIC BYPASS WITH UPPER ENDOSCOPY, Upper Endo, ERAS Pathway (N/A Abdomen)     Patient location during evaluation: PACU Anesthesia Type: General Level of consciousness: awake and alert Pain management: pain level controlled Vital Signs Assessment: post-procedure vital signs reviewed and stable Respiratory status: spontaneous breathing, nonlabored ventilation and respiratory function stable Cardiovascular status: blood pressure returned to baseline and stable Postop Assessment: no apparent nausea or vomiting Anesthetic complications: no    Last Vitals:  Vitals:   01/31/19 1145 01/31/19 1201  BP: 112/67 123/82  Pulse: 76 74  Resp: 13 15  Temp: 36.8 C 37 C  SpO2: 94% 96%    Last Pain:  Vitals:   01/31/19 1201  TempSrc: Oral  PainSc:                  Brennan Bailey

## 2019-01-31 NOTE — Interval H&P Note (Signed)
History and Physical Interval Note:  01/31/2019 7:11 AM  Mark Lang  has presented today for surgery, with the diagnosis of Morbid Obesity, HTN, GERD, DM II, Hypercholesteremia.  The various methods of treatment have been discussed with the patient and family. After consideration of risks, benefits and other options for treatment, the patient has consented to  Procedure(s): LAPAROSCOPIC ROUX-EN-Y GASTRIC BYPASS WITH UPPER ENDOSCOPY, Upper Endo, ERAS Pathway (N/A) as a surgical intervention.  The patient's history has been reviewed, patient examined, no change in status, stable for surgery.  I have reviewed the patient's chart and labs.  Questions were answered to the patient's satisfaction.     Mark Lang

## 2019-01-31 NOTE — Progress Notes (Signed)
PHARMACY CONSULT FOR:  Risk Assessment for Post-Discharge VTE Following Bariatric Surgery  Post-Discharge VTE Risk Assessment: This patient's probability of 30-day post-discharge VTE is increased due to the factors marked: x  Male    Age >/=60 years    BMI >/=50 kg/m2    CHF    Dyspnea at Rest    Paraplegia   x Non-gastric-band surgery    Operation Time >/=3 hr    Return to OR     Length of Stay >/= 3 d   Predicted probability of 30-day post-discharge VTE: 0.31%  Other patient-specific factors to consider:   Recommendation for Discharge: No pharmacologic prophylaxis post-discharge       Mark Lang is a 48 y.o. male who underwent  Laparoscopic roux-en-y gastric bypass with upper endoscopy on 01/31/2019   Case start: 0756 Case end: 1034   No Known Allergies  Patient Measurements: Height: 5' 5.5" (166.4 cm) Weight: 239 lb (108.4 kg) IBW/kg (Calculated) : 62.65 Body mass index is 39.17 kg/m.  Recent Labs    01/30/19 1047 01/30/19 1102  WBC 6.3  --   HGB 12.1*  --   HCT 39.3  --   PLT 340  --   CREATININE 0.89 0.83  ALBUMIN  --  4.6  PROT  --  7.8  AST  --  51*  ALT  --  77*  ALKPHOS  --  63  BILITOT  --  0.4   Estimated Creatinine Clearance: 127.4 mL/min (by C-G formula based on SCr of 0.83 mg/dL).    Past Medical History:  Diagnosis Date  . Diabetes mellitus, type 2 ([place])   . GERD (gastroesophageal reflux disease)   . Headache   . HTN (hypertension)    dr Einar Gip  cardiologist , reports he may have had a chemical stress test done   . Hypercholesteremia   . Insomnia   . Morbid obesity ([place])   . Neoplasm of tongue   . Seasonal allergies   . Vitamin D deficiency          Dolly Rias RPh 01/31/2019, 12:56 PM Pager [phone]

## 2019-01-31 NOTE — Transfer of Care (Signed)
Immediate Anesthesia Transfer of Care Note  Patient: Mark Lang  Procedure(s) Performed: LAPAROSCOPIC ROUX-EN-Y GASTRIC BYPASS WITH UPPER ENDOSCOPY, Upper Endo, ERAS Pathway (N/A Abdomen)  Patient Location: PACU  Anesthesia Type:General  Level of Consciousness: awake, alert  and oriented  Airway & Oxygen Therapy: Patient Spontanous Breathing and Patient connected to face mask oxygen  Post-op Assessment: Report given to RN and Post -op Vital signs reviewed and stable  Post vital signs: Reviewed and stable  Last Vitals:  Vitals Value Taken Time  BP    Temp    Pulse 92 01/31/2019 10:47 AM  Resp 16 01/31/2019 10:47 AM  SpO2 98 % 01/31/2019 10:47 AM  Vitals shown include unvalidated device data.  Last Pain:  Vitals:   01/31/19 0548  TempSrc:   PainSc: 4       Patients Stated Pain Goal: 4 (60/47/99 8721)  Complications: No apparent anesthesia complications

## 2019-01-31 NOTE — Op Note (Signed)
17 March, 2020 Surgeon: Althea Grimmer. 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  Closure of Peterson's defect. Upper endoscopy.   Description of Procedure:  The patient was taken to OR 1 at [facility] and given general anesthesia.  The abdomen was prepped with PCMX and draped sterilely.  A time out was performed.    The operation began by identifying the ligament of Treitz. I measured 40 cm downstream and divided the bowel with a 6 cm Covidian stapler.  I sutured a Penrose drain along the Roux limb end.  I measured a 1 meter (100 cm) Roux limb and then placed the distal bowels to the BP limb side by side and performed a stapled jejunojejunostomy. The common defect was closed from either end with 4-0 Vicryl using the Endo Stitch. The mesenteric defect was closed with a running 2-0 silk using the Endo Stitch. Tisseel was applied to the suture line.  The omentum was divided with the harmonic scalpel.  The Nathanson retractor was inserted in the left lateral segment of liver was retracted. The foregut dissection ensued.  About 5 cm down on the lessor curvature we began making the gastric pouch.  This was created with a series of firings of purple Covidien cartridges the first two without TRS and then with TRS.  A nice small pouch was created.   The Roux limb was then brought up with the candycane pointed left and a back row of sutures of 2-0 Vicryl were placed. I opened along the right side of each structure and inserted the 4.5 cm stapler to create the gastrojejunostomy. The common defect was closed from either end with 2-0 Vicryl and a second row was placed anterior to that the Ewald tube acting as a stent across the anastomosis. The Penrose drain was removed. Peterson's defect was closed with 2-0 silk.   Endoscopy was performed  by Dr. [name].  The incisions were injected with Exparel TAP block and were closed with 4-0 Monocryl and Dermabond.    The patient was taken to the recovery room in satisfactory condition.  Matt B. Hassell Done, MD, FACS

## 2019-01-31 NOTE — Anesthesia Procedure Notes (Signed)
Procedure Name: Intubation Date/Time: 01/31/2019 7:25 AM Performed by: Eben Burow, CRNA Pre-anesthesia Checklist: Patient identified, Emergency Drugs available, Suction available, Patient being monitored and Timeout performed Patient Re-evaluated:Patient Re-evaluated prior to induction Oxygen Delivery Method: Circle system utilized Preoxygenation: Pre-oxygenation with 100% oxygen Induction Type: IV induction Ventilation: Mask ventilation without difficulty Laryngoscope Size: Glidescope and 4 Grade View: Grade I Tube type: Oral Number of attempts: 1 Airway Equipment and Method: Stylet Placement Confirmation: ETT inserted through vocal cords under direct vision,  positive ETCO2 and breath sounds checked- equal and bilateral Secured at: 23 cm Tube secured with: Tape Dental Injury: Teeth and Oropharynx as per pre-operative assessment

## 2019-01-31 NOTE — Discharge Instructions (Signed)
° ° ° °GASTRIC BYPASS/SLEEVE ° Home Care Instructions ° ° These instructions are to help you care for yourself when you go home. ° °Call: If you have any problems. °• Call [phone] and ask for the surgeon on call °• If you need immediate help, come to the ER at [facility].  °• Tell the ER staff that you are a new post-op gastric bypass or gastric sleeve patient °  °Signs and symptoms to report: • Severe vomiting or nausea °o If you cannot keep down clear liquids for longer than 1 day, call your surgeon  °• Abdominal pain that does not get better after taking your pain medication °• Fever over 100.4° F with chills °• Heart beating over 100 beats a minute °• Shortness of breath at rest °• Chest pain °•  Redness, swelling, drainage, or foul odor at incision (surgical) sites °•  If your incisions open or pull apart °• Swelling or pain in calf (lower leg) °• Diarrhea (Loose bowel movements that happen often), frequent watery, uncontrolled bowel movements °• Constipation, (no bowel movements for 3 days) if this happens: Pick one °o Milk of Magnesia, 2 tablespoons by mouth, 3 times a day for 2 days if needed °o Stop taking Milk of Magnesia once you have a bowel movement °o Call your doctor if constipation continues °Or °o Miralax  (instead of Milk of Magnesia) following the label instructions °o Stop taking Miralax once you have a bowel movement °o Call your doctor if constipation continues °• Anything you think is not normal °  °Normal side effects after surgery: • Unable to sleep at night or unable to focus °• Irritability or moody °• Being tearful (crying) or depressed °These are common complaints, possibly related to your anesthesia medications that put you to sleep, stress of surgery, and change in lifestyle.  This usually goes away a few weeks after surgery.  If these feelings continue, call your primary care doctor. °  °Wound Care: You may have surgical glue, steri-strips, or staples over your incisions after  surgery °• Surgical glue:  Looks like a clear film over your incisions and will wear off a little at a time °• Steri-strips: Strips of tape over your incisions. You may notice a yellowish color on the skin under the steri-strips. This is used to make the   steri-strips stick better. Do not pull the steri-strips off - let them fall off °• Staples: Staples may be removed before you leave the hospital °o If you go home with staples, call [facility], [phone] at for an appointment with your surgeon’s nurse to have staples removed 10 days after surgery. °• Showering: You may shower two (2) days after your surgery unless your surgeon tells you differently °o Wash gently around incisions with warm soapy water, rinse well, and gently pat dry  °o No tub baths until staples are removed, steri-strips fall off or glue is gone.  °  °Medications: • Medications should be liquid or crushed if larger than the size of a dime °• Extended release pills (medication that release a little bit at a time through the day) should NOT be crushed or cut. (examples include XL, ER, DR, SR) °• Depending on the size and number of medications you take, you may need to space (take a few throughout the day)/change the time you take your medications so that you do not over-fill your pouch (smaller stomach) °• Make sure you follow-up with your primary care doctor to   make medication changes needed during rapid weight loss and life-style changes °• If you have diabetes, follow up with the doctor that orders your diabetes medication(s) within one week after surgery and check your blood sugar regularly. °• Do not drive while taking prescription pain medication  °• It is ok to take Tylenol by the bottle instructions with your pain medicine or instead of your pain medicine as needed.  DO NOT TAKE NSAIDS (EXAMPLES OF NSAIDS:  IBUPROFREN/ NAPROXEN)  °Diet:                    First 2 Weeks ° You will see the dietician t about two (2) weeks  after your surgery. The dietician will increase the types of foods you can eat if you are handling liquids well: °• If you have severe vomiting or nausea and cannot keep down clear liquids lasting longer than 1 day, call your surgeon @ ([phone]) °Protein Shake °• Drink at least 2 ounces of shake 5-6 times per day °• Each serving of protein shakes (usually 8 - 12 ounces) should have: °o 15 grams of protein  °o And no more than 5 grams of carbohydrate  °• Goal for protein each day: °o Men = 80 grams per day °o Women = 60 grams per day °• Protein powder may be added to fluids such as non-fat milk or Lactaid milk or unsweetened Soy/Almond milk (limit to 35 grams added protein powder per serving) ° °Hydration °• Slowly increase the amount of water and other clear liquids as tolerated (See Acceptable Fluids) °• Slowly increase the amount of protein shake as tolerated  °•  Sip fluids slowly and throughout the day.  Do not use straws. °• May use sugar substitutes in small amounts (no more than 6 - 8 packets per day; i.e. Splenda) ° °Fluid Goal °• The first goal is to drink at least 8 ounces of protein shake/drink per day (or as directed by the nutritionist); some examples of protein shakes are Syntrax Nectar, Adkins Advantage, EAS Edge HP, and Unjury. See handout from pre-op Bariatric Education Class: °o Slowly increase the amount of protein shake you drink as tolerated °o You may find it easier to slowly sip shakes throughout the day °o It is important to get your proteins in first °• Your fluid goal is to drink 64 - 100 ounces of fluid daily °o It may take a few weeks to build up to this °• 32 oz (or more) should be clear liquids  °And  °• 32 oz (or more) should be full liquids (see below for examples) °• Liquids should not contain sugar, caffeine, or carbonation ° °Clear Liquids: °• Water or Sugar-free flavored water (i.e. Fruit H2O, Propel) °• Decaffeinated coffee or tea (sugar-free) °• Crystal Lite, Wyler’s Lite,  Minute Maid Lite °• Sugar-free Jell-O °• Bouillon or broth °• Sugar-free Popsicle:   *Less than 20 calories each; Limit 1 per day ° °Full Liquids: °Protein Shakes/Drinks + 2 choices per day of other full liquids °• Full liquids must be: °o No More Than 15 grams of Carbs per serving  °o No More Than 3 grams of Fat per serving °• Strained low-fat cream soup (except Cream of Potato or Tomato) °• Non-Fat milk °• Fat-free Lactaid Milk °• Unsweetened Soy Or Unsweetened Almond Milk °• Low Sugar yogurt (Dannon Lite & Fit, Greek yogurt; Oikos Triple Zero; Chobani Simply 100; Yoplait 100 calorie Greek - No Fruit on the Bottom) ° °  °Vitamins   and Minerals • Start 1 day after surgery unless otherwise directed by your surgeon °• 2 Chewable Bariatric Specific Multivitamin / Multimineral Supplement with iron (Example: Bariatric Advantage Multi EA) °• Chewable Calcium with Vitamin D-3 °(Example: 3 Chewable Calcium Plus 600 with Vitamin D-3) °o Take 500 mg three (3) times a day for a total of 1500 mg each day °o Do not take all 3 doses of calcium at one time as it may cause constipation, and you can only absorb 500 mg  at a time  °o Do not mix multivitamins containing iron with calcium supplements; take 2 hours apart °• Menstruating women and those with a history of anemia (a blood disease that causes weakness) may need extra iron °o Talk with your doctor to see if you need more iron °• Do not stop taking or change any vitamins or minerals until you talk to your dietitian or surgeon °• Your Dietitian and/or surgeon must approve all vitamin and mineral supplements °  °Activity and Exercise: Limit your physical activity as instructed by your doctor.  It is important to continue walking at home.  During this time, use these guidelines: °• Do not lift anything greater than ten (10) pounds for at least two (2) weeks °• Do not go back to work or drive until your surgeon says you can °• You may have sex when you feel comfortable  °o It is  VERY important for male patients to use a reliable birth control method; fertility often increases after surgery  °o All hormonal birth control will be ineffective for 30 days after surgery due to medications given during surgery a barrier method must be used. °o Do not get pregnant for at least 18 months °• Start exercising as soon as your doctor tells you that you can °o Make sure your doctor approves any physical activity °• Start with a simple walking program °• Walk 5-15 minutes each day, 7 days per week.  °• Slowly increase until you are walking 30-45 minutes per day °Consider joining our BELT program. [phone] or email [email or web address] °  °Special Instructions Things to remember: °• Use your CPAP when sleeping if this applies to you ° °• [facility] has two free Bariatric Surgery Support Groups that meet monthly °o The 3rd Thursday of each month, 6 pm, [facility]  °o The 2nd Friday of each month, 11:45 am in the private dining room in the basement of [place] °• It is very important to keep all follow up appointments with your surgeon, dietitian, primary care physician, and behavioral health practitioner °• Routine follow up schedule with your surgeon include appointments at 2-3 weeks, 6-8 weeks, 6 months, and 1 year at a minimum.  Your surgeon may request to see you more often.   °o After the first year, please follow up with your bariatric surgeon and dietitian at least once a year in order to maintain best weight loss results °[facility]: [phone] °[facility]: [phone] °Bariatric Nurse Coordinator: [phone] °  °   Reviewed and Endorsed  °by [facility] Patient Education Committee, June, 2016 °Edits Approved: Aug, 2018 ° ° ° °

## 2019-01-31 NOTE — Op Note (Signed)
Mark Lang [id number] [birth year] 01/31/2019  Preoperative diagnosis: severe obesity  Postoperative diagnosis: Same   Procedure: Upper endoscopy   Surgeon: Gayland Curry M.D., FACS   Anesthesia: Gen.   Indications for procedure: 48 y.o. yo male undergoing a laparoscopic roux en y gastric bypass and an upper endoscopy was requested to evaluate the anastomosis.  Description of procedure: After we have completed the new gastrojejunostomy, I scrubbed out and obtained the Olympus endoscope. I gently placed endoscope in the patient's oropharynx and gently glided it down the esophagus without any difficulty under direct visualization. Once I was in the gastric pouch, I insufflated the pouch was air. The pouch was approximately 5 cm in size. I was able to cannulate and advanced the scope through the gastrojejunostomy. Dr.Martin had placed saline in the upper abdomen. Upon further insufflation of the gastric pouch there was no evidence of bubbles. Upon further inspection of the gastric pouch, the mucosa appeared normal. There is no evidence of any mucosal abnormality. The gastric pouch and Roux limb were decompressed. The width of the gastrojejunal anastomosis was at least 2.5 cm. The scope was withdrawn. The patient tolerated this portion of the procedure well. Please see Dr Earlie Server operative note for details regarding the laparoscopic roux-en-y gastric bypass.  Mark Ruff. [name], MD, FACS General, Bariatric, & Minimally Invasive Surgery [facility], [place]

## 2019-01-31 NOTE — Progress Notes (Signed)
Water started and ice.

## 2019-01-31 NOTE — Progress Notes (Signed)
Discussed post op day goals with patient including ambulation, IS, diet progression, pain, and nausea control.  Questions answered. 

## 2019-02-01 ENCOUNTER — Encounter (HOSPITAL_COMMUNITY): Payer: Self-pay | Admitting: Surgery

## 2019-02-01 LAB — CBC WITH DIFFERENTIAL/PLATELET
Abs Immature Granulocytes: 0.02 10*3/uL (ref 0.00–0.07)
BASOS ABS: 0 10*3/uL (ref 0.0–0.1)
Basophils Relative: 0 %
EOS ABS: 0 10*3/uL (ref 0.0–0.5)
Eosinophils Relative: 0 %
HCT: 39.1 % (ref 39.0–52.0)
Hemoglobin: 11.7 g/dL — ABNORMAL LOW (ref 13.0–17.0)
IMMATURE GRANULOCYTES: 0 %
Lymphocytes Relative: 27 %
Lymphs Abs: 2.4 10*3/uL (ref 0.7–4.0)
MCH: 24.1 pg — ABNORMAL LOW (ref 26.0–34.0)
MCHC: 29.9 g/dL — ABNORMAL LOW (ref 30.0–36.0)
MCV: 80.6 fL (ref 80.0–100.0)
Monocytes Absolute: 0.6 10*3/uL (ref 0.1–1.0)
Monocytes Relative: 7 %
Neutro Abs: 5.8 10*3/uL (ref 1.7–7.7)
Neutrophils Relative %: 66 %
Platelets: 333 10*3/uL (ref 150–400)
RBC: 4.85 MIL/uL (ref 4.22–5.81)
RDW: 15.3 % (ref 11.5–15.5)
WBC: 8.9 10*3/uL (ref 4.0–10.5)
nRBC: 0 % (ref 0.0–0.2)

## 2019-02-01 LAB — GLUCOSE, CAPILLARY
GLUCOSE-CAPILLARY: 112 mg/dL — AB (ref 70–99)
Glucose-Capillary: 121 mg/dL — ABNORMAL HIGH (ref 70–99)
Glucose-Capillary: 109 mg/dL — ABNORMAL HIGH (ref 70–99)

## 2019-02-01 MED ORDER — HYDROCODONE-ACETAMINOPHEN 5-325 MG PO TABS: 1.0000 | ORAL_TABLET | ORAL | 0 refills | Status: DC | PRN

## 2019-02-01 MED ORDER — PANTOPRAZOLE SODIUM 40 MG PO TBEC: 40.0000 mg | DELAYED_RELEASE_TABLET | Freq: Every day | ORAL | 0 refills | Status: AC

## 2019-02-01 MED ORDER — OXYCODONE-ACETAMINOPHEN 5-325 MG PO TABS: 1.0000 | ORAL_TABLET | ORAL | Status: DC | PRN

## 2019-02-01 MED ORDER — ONDANSETRON 4 MG PO TBDP: 4.0000 mg | ORAL_TABLET | Freq: Four times a day (QID) | ORAL | 0 refills | Status: AC | PRN

## 2019-02-01 MED ORDER — OXYCODONE-ACETAMINOPHEN 5-325 MG PO TABS: 1.0000 | ORAL_TABLET | ORAL | 0 refills | Status: AC | PRN

## 2019-02-01 NOTE — Progress Notes (Signed)
Patient alert and oriented, pain is controlled. Patient is tolerating fluids, advanced to protein shake today, patient is tolerating well. Reviewed Gastric Bypass discharge instructions with patient and patient is able to articulate understanding. Provided information on BELT program, Support Group and [facility] outpatient pharmacy. All questions answered, will continue to monitor.   Total fluid intake 660 Per dehydration protocol call back one week post op 

## 2019-02-01 NOTE — Progress Notes (Signed)
Patient had no questions regarding his discharge. NT will wheel patient out to the front of the building

## 2019-02-01 NOTE — Discharge Summary (Signed)
Physician Discharge Summary  Patient ID: Mark Lang MRN: [id number] DOB/AGE: [birth year] 47 y.o.  PCP: Amalia Greenhouse, MD  Admit date: 01/31/2019 Discharge date: 02/01/2019  Admission Diagnoses:  Metabolic syndrome and obesity  Discharge Diagnoses:  same  Active Problems:   Metabolic syndrome   History of Roux-en-Y gastric bypass   Surgery:  Lap roux en Y gastric bypass  Discharged Condition: improved  Hospital Course:   Had surgery and was started on clears and advanced.  Did well and ready to go home on POD !.   Consults: none  Significant Diagnostic Studies: none    Discharge Exam: Blood pressure 114/70, pulse 70, temperature 98.5 F (36.9 C), temperature source Oral, resp. rate 18, height 5' 5.5" (1.664 m), weight 108.4 kg, SpO2 97 %. Incisions healing OK  Disposition: Discharge disposition: 01-Home or Self Care       Discharge Instructions    Ambulate hourly while awake   Complete by:  As directed    Ambulate hourly while awake   Complete by:  As directed    Call MD for:  difficulty breathing, headache or visual disturbances   Complete by:  As directed    Call MD for:  difficulty breathing, headache or visual disturbances   Complete by:  As directed    Call MD for:  persistant dizziness or light-headedness   Complete by:  As directed    Call MD for:  persistant dizziness or light-headedness   Complete by:  As directed    Call MD for:  persistant nausea and vomiting   Complete by:  As directed    Call MD for:  persistant nausea and vomiting   Complete by:  As directed    Call MD for:  redness, tenderness, or signs of infection (pain, swelling, redness, odor or green/yellow discharge around incision site)   Complete by:  As directed    Call MD for:  redness, tenderness, or signs of infection (pain, swelling, redness, odor or green/yellow discharge around incision site)   Complete by:  As directed    Call MD for:  severe uncontrolled pain   Complete by:   As directed    Call MD for:  severe uncontrolled pain   Complete by:  As directed    Call MD for:  temperature >101 F   Complete by:  As directed    Call MD for:  temperature >101 F   Complete by:  As directed    Diet bariatric full liquid   Complete by:  As directed    Diet bariatric full liquid   Complete by:  As directed    Incentive spirometry   Complete by:  As directed    Perform hourly while awake   Incentive spirometry   Complete by:  As directed    Perform hourly while awake     Allergies as of 02/01/2019   No Known Allergies     Medication List    STOP taking these medications   aspirin EC 81 MG tablet     TAKE these medications   ASHWAGANDHA PO Take 1,600 mg by mouth at bedtime.   cimetidine 200 MG tablet Commonly known as:  TAGAMET Take 200 mg by mouth at bedtime as needed (heart burn).   diphenhydramine-acetaminophen 25-500 MG Tabs tablet Commonly known as:  TYLENOL PM Take 2 tablets by mouth at bedtime as needed (slee).   famotidine 20 MG tablet Commonly known as:  PEPCID Take 40 mg by mouth daily.  fexofenadine 180 MG tablet Commonly known as:  ALLEGRA Take 180 mg by mouth daily.   FISH OIL PO Take 2 capsules by mouth daily.   HYDROcodone-acetaminophen 5-325 MG tablet Commonly known as:  NORCO/VICODIN Take 1 tablet by mouth every 4 (four) hours as needed for moderate pain or severe pain.   metFORMIN 1000 MG tablet Commonly known as:  GLUCOPHAGE Take 1,000 mg by mouth 2 (two) times daily with a meal. Notes to patient:  Monitor Blood Sugar Frequently and keep a log for primary care physician, you may need to adjust medication dosage with rapid weight loss.     metoprolol succinate 25 MG 24 hr tablet Commonly known as:  TOPROL-XL Take 25 mg by mouth daily. Notes to patient:  Monitor Blood Pressure Daily and keep a log for primary care physician.  You may need to make changes to your medications with rapid weight loss.     omeprazole 40 MG  capsule Commonly known as:  PRILOSEC Take 40 mg by mouth 2 (two) times daily.   ondansetron 4 MG disintegrating tablet Commonly known as:  ZOFRAN-ODT Take 1 tablet (4 mg total) by mouth every 6 (six) hours as needed for nausea or vomiting.   pantoprazole 40 MG tablet Commonly known as:  PROTONIX Take 1 tablet (40 mg total) by mouth daily.   pravastatin 80 MG tablet Commonly known as:  PRAVACHOL Take 80 mg by mouth at bedtime.   simethicone 125 MG chewable tablet Commonly known as:  MYLICON Chew 280 mg by mouth 3 (three) times daily as needed for flatulence.   SUPER B COMPLEX/C PO Take 2 tablets by mouth daily.   VITAMIN B-12 PO Take 5,000 mcg by mouth daily.   vitamin C 1000 MG tablet Take 2,000 mg by mouth daily.   Vitamin D (Ergocalciferol) 1.25 MG (50000 UT) Caps capsule Commonly known as:  DRISDOL Take 50,000 Units by mouth every Saturday.      Follow-up Information    Surgery, [place]. Go on 03/02/2019.   Specialty:  General Surgery Why:  at 1130 Contact information: [address] [place] [address] [phone]        Carlena Hurl, PA-C. Go on 04/11/2019.   Specialty:  General Surgery Why:  at 2 Contact information: [place] [address] [phone]           Signed: Pedro Earls 02/01/2019, 1:47 PM

## 2019-02-01 NOTE — Plan of Care (Signed)
Nutrition Education Note  Received consult for diet education per DROP protocol. Patient reports he still needs to buy his protein shakes for home and his daily MVI and calcium supplements.  Discussed 2 week post op diet with pt. Emphasized that liquids must be non carbonated, non caffeinated, and sugar free. Fluid goals discussed. Pt to follow up with outpatient bariatric RD for further diet progression after 2 weeks. Multivitamins and minerals also reviewed. Teach back method used, pt expressed understanding, expect good compliance.   Diet: First 2 Weeks  You will see the dietitian about two (2) weeks after your surgery. The dietitian will increase the types of foods you can eat if you are handling liquids well:  If you have severe vomiting or nausea and cannot handle clear liquids lasting longer than 1 day, call your surgeon  Protein Shake  Drink at least 2 ounces of shake 5-6 times per day  Each serving of protein shakes (usually 8 - 12 ounces) should have a minimum of:  15 grams of protein  And no more than 5 grams of carbohydrate  Goal for protein each day:  Men = 80 grams per day  Women = 60 grams per day  Protein powder may be added to fluids such as non-fat milk or Lactaid milk or Soy milk (limit to 35 grams added protein powder per serving)   Hydration  Slowly increase the amount of water and other clear liquids as tolerated (See Acceptable Fluids)  Slowly increase the amount of protein shake as tolerated  Sip fluids slowly and throughout the day  May use sugar substitutes in small amounts (no more than 6 - 8 packets per day; i.e. Splenda)   Fluid Goal  The first goal is to drink at least 8 ounces of protein shake/drink per day (or as directed by the nutritionist); some examples of protein shakes are Premier Protein, [employer], [employer], EAS Edge HP, and Unjury. See handout from pre-op Bariatric Education Class:  Slowly increase the amount of protein shake you drink  as tolerated  You may find it easier to slowly sip shakes throughout the day  It is important to get your proteins in first  Your fluid goal is to drink 64 - 100 ounces of fluid daily  It may take a few weeks to build up to this  32 oz (or more) should be clear liquids  And  32 oz (or more) should be full liquids (see below for examples)  Liquids should not contain sugar, caffeine, or carbonation   Clear Liquids:  Water or Sugar-free flavored water (i.e. Fruit H2O, Propel)  Decaffeinated coffee or tea (sugar-free)  Crystal Lite, Wyler's Lite, Minute Maid Lite  Sugar-free Jell-O  Bouillon or broth  Sugar-free Popsicle: *Less than 20 calories each; Limit 1 per day   Full Liquids:  Protein Shakes/Drinks + 2 choices per day of other full liquids  Full liquids must be:  No More Than 12 grams of Carbs per serving  No More Than 3 grams of Fat per serving  Strained low-fat cream soup  Non-Fat milk  Fat-free Lactaid Milk  Sugar-free yogurt (Dannon Lite & Fit, [place] yogurt, Oikos Zero)   Clayton Bibles, MS, RD, LDN [facility] Diet[facility] Pager: [phone] After Hours Pager: [phone]

## 2019-02-03 ENCOUNTER — Telehealth (HOSPITAL_COMMUNITY): Payer: Self-pay

## 2019-02-03 NOTE — Telephone Encounter (Signed)
Patient called to discuss post bariatric surgery follow up questions.  See below:   1.  Tell me about your pain and pain management?taking prescribed pain medication that helps  2.  Let's talk about fluid intake.  How much total fluid are you taking in?40 ounces  3.  How much protein have you taken in the last 2 days?30 grams  4.  Have you had nausea?  Tell me about when have experienced nausea and what you did to help?denies but does feel full at times  5.  Has the frequency or color changed with your urine?darker in morning , lightens throughout the day  6.  Tell me what your incisions look like?no problems  7.  Have you been passing gas? BM?had bm  8.  If a problem or question were to arise who would you call?  Do you know contact numbers for [place], [facility], and [facility]?aware of contact numbers  9.  How has the walking going?walking at home  10.  How are your vitamins and calcium going?  How are you taking them?states taking as directed, 3 calcium and mvi  Directed to try to increase fluids each day as tolerated.  Call office with any symptoms of dehydrations (lightheaded, dark urine, headaches). Has not made appointment with Dr. Posey Pronto, discussed the importance of follow up.  States understanding.

## 2019-02-14 ENCOUNTER — Ambulatory Visit: Payer: Self-pay

## 2019-02-28 ENCOUNTER — Encounter: Payer: BLUE CROSS/BLUE SHIELD | Attending: Surgery | Admitting: Skilled Nursing Facility1

## 2019-02-28 ENCOUNTER — Other Ambulatory Visit: Payer: Self-pay

## 2019-02-28 DIAGNOSIS — E669 Obesity, unspecified: Secondary | ICD-10-CM

## 2019-02-28 NOTE — Progress Notes (Signed)
4 Week Post-Operative Nutrition Class  Patient was seen on 01/10/19 for Post-Operative Nutrition education at the [facility].   Pt states he is having some trouble with hard stools.  Pt was not able to give a 24 hr recall because he does not know what he eats so he called his wife. Pt states he has eaten too much a few times. Pt states he tries to make a schedule for himself. Pt states he ate bread which hurt his stomach.   Surgery date: 01/31/2019 Surgery type: RYGB  Start weight at [facility]: 237.7 Weight today: 216.6  Medications: testosterone, vitamin d, calcium, general multivitamin, acid reducer   24 hr recall: 3 protein shakes a day; 2-3 days a week fruit  1 protein shake [place] soup (pts wife makes it he does not know what is in it) or lentil soup or veggie soup Chicken or tofu   4-5 bottles of water a day  Body Composition Scale  Total Body Fat:  31.9%  Visceral Fat: 23  Fat-Free Mass:  49%              Total Body Water:   49%             Muscle-Mass: 40.7 lbs  Body Fat Displacement:  Torso: 42.8 lbs Left Leg:  8.5lbs Right Leg: 8.5lbs Left Arm:  4.2lbs Right Arm: 4.2 lbs  Physical activity: walking 60 minutes 5-7 days a week  The following the learning objectives were met by the patient during this course:  Identifies Phase 3A (Soft, High Proteins) Dietary Goals and will begin from 2 weeks post-operatively to 2 months post-operatively  Identifies appropriate sources of fluids and proteins   States protein recommendations and appropriate sources post-operatively  Identifies the need for appropriate texture modifications, mastication, and bite sizes when consuming solids  Identifies appropriate multivitamin and calcium sources post-operatively  Describes the need for physical activity post-operatively and will follow MD recommendations  States when to call healthcare provider regarding medication questions or  post-operative complications  Handouts given during class include:  Phase 3A: Soft, High Protein Diet Handout  Follow-Up Plan: Patient will follow-up at [facility] in 6 weeks for 2 month post-op nutrition visit for diet advancement per MD.

## 2019-03-23 DIAGNOSIS — K210 Gastro-esophageal reflux disease with esophagitis: Secondary | ICD-10-CM | POA: Diagnosis not present

## 2019-03-23 DIAGNOSIS — E559 Vitamin D deficiency, unspecified: Secondary | ICD-10-CM | POA: Diagnosis not present

## 2019-03-23 DIAGNOSIS — Z79899 Other long term (current) drug therapy: Secondary | ICD-10-CM | POA: Diagnosis not present

## 2019-03-23 DIAGNOSIS — Z6841 Body Mass Index (BMI) 40.0 and over, adult: Secondary | ICD-10-CM | POA: Diagnosis not present

## 2019-04-04 ENCOUNTER — Encounter: Payer: Self-pay | Admitting: Skilled Nursing Facility1

## 2019-04-04 ENCOUNTER — Encounter: Payer: BLUE CROSS/BLUE SHIELD | Attending: Surgery | Admitting: Skilled Nursing Facility1

## 2019-04-04 ENCOUNTER — Other Ambulatory Visit: Payer: Self-pay

## 2019-04-04 DIAGNOSIS — E669 Obesity, unspecified: Secondary | ICD-10-CM | POA: Diagnosis not present

## 2019-04-04 NOTE — Progress Notes (Addendum)
Bariatric Follow-Up Visit 2 Months Medical Nutrition Therapy  Appt Start Time: 11:43 End Time: 12:15 Primary Concerns Today: Bariatric Surgery Nutrition Follow Up   Bariatric Surgery Type: RYGB   Surgery Date: 01/31/2019   NUTRITION ASSESSMENT   Anthropometrics  28.7Start weight at [facility]: 237.7  lbs  Today's weight: 197.6  lbs Weight change: 19  lbs (since previous nutrition appointment)   Body Composition Results Body Composition Scale 04/04/2019  Total Body Fat % 28.7  Visceral Fat 19  Fat-Free Mass % 71.2   Total Body Water % 52.2   Muscle-Mass lbs 37.1  Body Fat Displacement          Torso  lbs 35         Left Leg  lbs 7         Right Leg  lbs 7         Left Arm  lbs 3.5         Right Arm   lbs 3.5   Psychosocial/Lifestyle  Pt states his iron is low so he is taking a iron supplement now. Pt states he drinks 2 protein shakes a day and has gas. Pt states he cannot fit as much chicken as other foods. Pt states he eats 2 mangoes at once. Communication is tough with pt so need concise specific language.   Medications:  Labs:  Supplements:  vitmain D + celebrate + iron + calcium    24-Hr Dietary Recall:  First Meal: protein shake premier  Snack:  Second Meal: 1-2 pm protein shake premier Snack: watermelon Third Meal: brown rice and beans  Snack: sugar free jello and fruit  Beverages: water    Estimated Daily Fluid Intake: 64+ oz Estimated Daily Protein Intake:  g   Physical Activity  Current average weekly physical activity: 1.5 hours walk 6 days a week (pt was tough to understand unsure if all at once)    Signs/Symptoms  Using straws: no Drinking while eating: no Chewing/swallowing difficulties: no Changes in vision: no Changes to mood/headaches: no Hair loss/changes to skin/nails: no Difficulty focusing/concentrating: no Sweating: no Dizziness/lightheadedness: no Palpitations: no Carbonated/caffeinated beverages: no N/V/D/C/Gas: no Abdominal pain:  no Dumping syndrome: no     NUTRITION DIAGNOSIS  Overweight/obesity ([place]-3.3) related to past poor dietary habits and physical inactivity as evidenced by patient w/ completed Bariatric surgery following dietary guidelines for continued weight loss and healthy nutrition status.     NUTRITION INTERVENTION Nutrition counseling (C-1) and education (E-2) to facilitate bariatric surgery goals, including:  Diet advancement to the next phase now including non-starchy vegetables  The importance of consuming adequate calories as well as certain nutrients daily due to the body's need for essential vitamins, minerals, and fats  The importance of daily physical activity and to reach a goal of at least 150 minutes of moderate to vigorous physical activity weekly (or as directed by their physician) due to benefits such as increased musculature and improved lab values  Pt Chosen Goals: -Do not take calcium and iron at the same time -Do not take all 3 calcium at the same time -Measure your cooked rice: 1/3 cup at a time  -Limit yourself to 1 protein shake a day -Add in resistance 2 days a Week: try youtube for workout ideas  -Limit yourself to half a mango at once; 1 whole mango a day -Limit your nuts to 1 time a day at 1/4 cup  -For any fruit stick to 1/2 cup -Buy measuring cups  Readiness for Change: ready  Demonstrated degree of understanding via: Teach Back     MONITORING & EVALUATION Dietary intake, weekly physical activity, and body weight follow up in 6 weeks

## 2019-04-04 NOTE — Patient Instructions (Addendum)
-  Do not take calcium and iron at the same time  -Do not take all 3 calcium at the same time  -Measure your cooked rice: 1/3 cup at a time   -Limit yourself to 1 protein shake a day  -Add in resistance 2 days a Week: try youtube for workout ideas   -Limit yourself to half a mango at once; 1 whole mango a day  -Limit your nuts to 1 time a day at 1/4 cup   -For any fruit stick to 1/2 cup

## 2019-04-05 ENCOUNTER — Ambulatory Visit: Payer: BLUE CROSS/BLUE SHIELD | Admitting: Skilled Nursing Facility1

## 2019-05-23 ENCOUNTER — Encounter: Payer: BC Managed Care – PPO | Attending: Surgery | Admitting: Skilled Nursing Facility1

## 2019-05-23 ENCOUNTER — Other Ambulatory Visit: Payer: Self-pay

## 2019-05-23 DIAGNOSIS — E669 Obesity, unspecified: Secondary | ICD-10-CM | POA: Insufficient documentation

## 2019-05-23 NOTE — Progress Notes (Signed)
Bariatric Follow-Up Visit 2 Months Medical Nutrition Therapy  Appt Start Time: 11:43 End Time: 12:15 Primary Concerns Today: Bariatric Surgery Nutrition Follow Up   Bariatric Surgery Type: RYGB   Surgery Date: 01/31/2019   NUTRITION ASSESSMENT   Anthropometrics  Start weight at [facility]: 237.7  lbs  Today's weight: 197.6  lbs Weight change: 20.8  lbs (since previous nutrition appointment)   Body Composition Results Body Composition Scale 04/04/2019 05/23/2019  Total Body Fat % 28.7 24.2  Visceral Fat 19 15  Fat-Free Mass % 71.2 75.7   Total Body Water % 52.2 56.7   Muscle-Mass lbs 37.1 33.2  Body Fat Displacement           Torso  lbs 35 26.5         Left Leg  lbs 7 5.3         Right Leg  lbs 7 5.3         Left Arm  lbs 3.5 7.6         Right Arm   lbs 3.5 7.6   Psychosocial/Lifestyle  Pt states his iron is low so he is taking a iron supplement now. Pt states he drinks 2 protein shakes a day and has gas. Pt states he cannot fit as much chicken as other foods. Pt states he eats 2 mangoes at once.  Pt arrives taking his supplement appropriately. Pt states he is feeling good. Pt states he takes miralax sometimes. Pt has been skipping meals to lose more weight: Dietitian advised against this behavior.  Communication is tough with pt so need concise specific language.   Medications:  Labs:  Supplements:  vitmain D + celebrate + iron + calcium    24-Hr Dietary Recall: eating banana everyday; 1 meal a day First Meal: skipped   Snack: almonds or banana Second Meal: 1pm banana  Snack 1:30-2: vegetable soup or brown rice mixed with yogurt (not greek) Third Meal: skipped Snack: sugar free jello and fruit  Beverages: water    Estimated Daily Fluid Intake: 64+ oz Estimated Daily Protein Intake: 50 g   Physical Activity  Current average weekly physical activity: 1.5-2 hours walk/bicycle/running 6 days a week (pt was tough to understand unsure if all at once)    Signs/Symptoms   Using straws: no Drinking while eating: no Chewing/swallowing difficulties: no Changes in vision: no Changes to mood/headaches: no Hair loss/changes to skin/nails: no Difficulty focusing/concentrating: no Sweating: no Dizziness/lightheadedness: no Palpitations: no Carbonated/caffeinated beverages: no N/V/D/C/Gas: no Abdominal pain: no Dumping syndrome: no     NUTRITION DIAGNOSIS  Overweight/obesity ([place]-3.3) related to past poor dietary habits and physical inactivity as evidenced by patient w/ completed Bariatric surgery following dietary guidelines for continued weight loss and healthy nutrition status.     NUTRITION INTERVENTION Nutrition counseling (C-1) and education (E-2) to facilitate bariatric surgery goals, including:  The importance of consuming adequate calories as well as certain nutrients daily due to the body's need for essential vitamins, minerals, and fats  The importance of daily physical activity and to reach a goal of at least 150 minutes of moderate to vigorous physical activity weekly (or as directed by their physician) due to benefits such as increased musculature and improved lab values  Pt Chosen Goals: -You need to eat 3 meals a day For breakfast: boiled egg and fruit -[place] yogurt is better Protein foods:  Soy, eggs, cheese, greek yogurt, beans, lentils Drink a sugar free drink after your workouts   Readiness for Change: ready  Demonstrated degree of understanding via: Teach Back     MONITORING & EVALUATION Dietary intake, weekly physical activity, and body weight follow up in 2 months

## 2019-05-23 NOTE — Patient Instructions (Addendum)
-  You need to eat 3 meals a day  For breakfast: boiled egg and fruit  -[place] yogurt is better  Protein foods:   Soy, eggs, cheese, greek yogurt, beans, lentils  Drink a sugar free drink after your workouts

## 2019-05-24 ENCOUNTER — Telehealth (HOSPITAL_COMMUNITY): Payer: Self-pay

## 2019-05-30 ENCOUNTER — Ambulatory Visit: Payer: Self-pay | Admitting: Skilled Nursing Facility1

## 2019-06-16 DIAGNOSIS — Z20828 Contact with and (suspected) exposure to other viral communicable diseases: Secondary | ICD-10-CM | POA: Diagnosis not present

## 2019-07-04 DIAGNOSIS — H9209 Otalgia, unspecified ear: Secondary | ICD-10-CM | POA: Diagnosis not present

## 2019-07-04 DIAGNOSIS — K1321 Leukoplakia of oral mucosa, including tongue: Secondary | ICD-10-CM | POA: Diagnosis not present

## 2019-07-26 ENCOUNTER — Ambulatory Visit: Payer: BC Managed Care – PPO | Admitting: Skilled Nursing Facility1

## 2019-07-31 ENCOUNTER — Other Ambulatory Visit: Payer: Self-pay

## 2019-07-31 ENCOUNTER — Encounter: Payer: BC Managed Care – PPO | Attending: Surgery | Admitting: Skilled Nursing Facility1

## 2019-07-31 DIAGNOSIS — E669 Obesity, unspecified: Secondary | ICD-10-CM | POA: Insufficient documentation

## 2019-07-31 DIAGNOSIS — E119 Type 2 diabetes mellitus without complications: Secondary | ICD-10-CM

## 2019-07-31 NOTE — Progress Notes (Signed)
Bariatric Follow-Up Visit  Medical Nutrition Therapy  Appt Start Time: 11:43 End Time: 12:15 Primary Concerns Today: Bariatric Surgery Nutrition Follow Up   Bariatric Surgery Type: RYGB   Surgery Date: 01/31/2019   NUTRITION ASSESSMENT   Anthropometrics  Start weight at [facility]: 237.7  lbs  Today's weight: 197.6  lbs Weight change: 20.8  lbs (since previous nutrition appointment)   Body Composition Results Body Composition Scale 04/04/2019 05/23/2019 07/31/2019  Total Body Fat % 28.7 24.2 22  Visceral Fat 19 15 13   Fat-Free Mass % 71.2 75.7 77.9   Total Body Water % 52.2 56.7 58.9   Muscle-Mass lbs 37.1 33.2 31.4  Body Fat Displacement            Torso  lbs 35 26.5 22.8         Left Leg  lbs 7 5.3 4.5         Right Leg  lbs 7 5.3 4.5         Left Arm  lbs 3.5 7.6 2.2         Right Arm   lbs 3.5 7.6 2.2   Psychosocial/Lifestyle  Communication is tough with pt so need concise specific language.   Pt states if he does not eat every 2 hours he gets an acid feeling in his stomach.  Pt states his energy level is good.  Pt states his A1C is 5.8.   Medications:  Labs:  Supplements:  vitmain D + celebrate + iron + calcium    24-Hr Dietary Recall: eating banana everyday First Meal: 4 eggs and vegetables  Snack: almonds or banana Second Meal: 1pm banana chicken and fish Snack 1:30-2: vegetable soup or brown rice mixed with yogurt Third Meal: 2-3 eggs Snack: sugar free jello and fruit or greek yogurt and fruit Beverages: water, coffee   Estimated Daily Fluid Intake: 70+ oz Estimated Daily Protein Intake: 50 g   Physical Activity  Current average weekly physical activity: 1.5-2 hours walk/bicycle/running 6 days a week (pt was tough to understand unsure if all at once)    Signs/Symptoms  Using straws: no Drinking while eating: no Chewing/swallowing difficulties: no Changes in vision: no Changes to mood/headaches: no Hair loss/changes to skin/nails: no Difficulty  focusing/concentrating: no Sweating: no Dizziness/lightheadedness: no Palpitations: no Carbonated/caffeinated beverages: no N/V/D/C/Gas: no Abdominal pain: no Dumping syndrome: no     NUTRITION DIAGNOSIS  Overweight/obesity ([place]-3.3) related to past poor dietary habits and physical inactivity as evidenced by patient w/ completed Bariatric surgery following dietary guidelines for continued weight loss and healthy nutrition status.     NUTRITION INTERVENTION Nutrition counseling (C-1) and education (E-2) to facilitate bariatric surgery goals, including:  The importance of consuming adequate calories as well as certain nutrients daily due to the body's need for essential vitamins, minerals, and fats  The importance of daily physical activity and to reach a goal of at least 150 minutes of moderate to vigorous physical activity weekly (or as directed by their physician) due to benefits such as increased musculature and improved lab values  Pt Chosen Goals: Continue to eat every 2-3 hours    Readiness for Change: ready  Demonstrated degree of understanding via: Teach Back     MONITORING & EVALUATION Dietary intake, weekly physical activity, and body weight follow up in 3 months

## 2019-09-01 DIAGNOSIS — Z9884 Bariatric surgery status: Secondary | ICD-10-CM | POA: Diagnosis not present

## 2019-09-08 DIAGNOSIS — Z9884 Bariatric surgery status: Secondary | ICD-10-CM | POA: Diagnosis not present

## 2019-09-14 DIAGNOSIS — M545 Low back pain: Secondary | ICD-10-CM | POA: Diagnosis not present

## 2019-09-14 DIAGNOSIS — M549 Dorsalgia, unspecified: Secondary | ICD-10-CM | POA: Diagnosis not present

## 2019-09-14 DIAGNOSIS — M5489 Other dorsalgia: Secondary | ICD-10-CM | POA: Diagnosis not present

## 2019-09-14 DIAGNOSIS — M542 Cervicalgia: Secondary | ICD-10-CM | POA: Diagnosis not present

## 2019-09-14 DIAGNOSIS — R52 Pain, unspecified: Secondary | ICD-10-CM | POA: Diagnosis not present

## 2019-09-14 DIAGNOSIS — M546 Pain in thoracic spine: Secondary | ICD-10-CM | POA: Diagnosis not present

## 2019-09-14 DIAGNOSIS — Z23 Encounter for immunization: Secondary | ICD-10-CM | POA: Diagnosis not present

## 2019-09-14 DIAGNOSIS — Y999 Unspecified external cause status: Secondary | ICD-10-CM | POA: Diagnosis not present

## 2019-09-14 DIAGNOSIS — S0501XA Injury of conjunctiva and corneal abrasion without foreign body, right eye, initial encounter: Secondary | ICD-10-CM | POA: Diagnosis not present

## 2019-09-14 DIAGNOSIS — Y92410 Unspecified street and highway as the place of occurrence of the external cause: Secondary | ICD-10-CM | POA: Diagnosis not present

## 2019-10-31 ENCOUNTER — Encounter: Payer: BC Managed Care – PPO | Admitting: Skilled Nursing Facility1

## 2019-11-07 ENCOUNTER — Ambulatory Visit: Payer: BC Managed Care – PPO | Admitting: Skilled Nursing Facility1

## 2019-11-07 ENCOUNTER — Encounter: Payer: Self-pay | Admitting: Family Medicine

## 2019-11-07 ENCOUNTER — Ambulatory Visit (INDEPENDENT_AMBULATORY_CARE_PROVIDER_SITE_OTHER): Payer: BC Managed Care – PPO | Admitting: Family Medicine

## 2019-11-07 ENCOUNTER — Other Ambulatory Visit: Payer: Self-pay

## 2019-11-07 VITALS — BP 120/68 | HR 68 | Ht 64.0 in | Wt 174.0 lb

## 2019-11-07 DIAGNOSIS — M7061 Trochanteric bursitis, right hip: Secondary | ICD-10-CM

## 2019-11-07 DIAGNOSIS — S39012A Strain of muscle, fascia and tendon of lower back, initial encounter: Secondary | ICD-10-CM | POA: Diagnosis not present

## 2019-11-07 DIAGNOSIS — M7062 Trochanteric bursitis, left hip: Secondary | ICD-10-CM | POA: Diagnosis not present

## 2019-11-07 DIAGNOSIS — M5416 Radiculopathy, lumbar region: Secondary | ICD-10-CM | POA: Diagnosis not present

## 2019-11-07 MED ORDER — GABAPENTIN 300 MG PO CAPS: 300.0000 mg | ORAL_CAPSULE | Freq: Three times a day (TID) | ORAL | 1 refills | Status: AC | PRN

## 2019-11-07 MED ORDER — PREDNISONE 50 MG PO TABS: 50.0000 mg | ORAL_TABLET | Freq: Every day | ORAL | 0 refills | Status: DC

## 2019-11-07 NOTE — Patient Instructions (Signed)
Thank you for coming in today. OK to try over the counter voltaren gel and icy hot or biofreeze.  Ok to use heat on the back and TENS unit.  However the most effective treatment is PT.  This will start soon.  Take prednisone daily for 5 days.  Use gabapentin for nerve pain.  Recheck with me in 4 weeks.  Return sooner if needed.    Radicular Pain Radicular pain is a type of pain that spreads from your back or neck along a spinal nerve. Spinal nerves are nerves that leave the spinal cord and go to the muscles. Radicular pain is sometimes called radiculopathy, radiculitis, or a pinched nerve. When you have this type of pain, you may also have weakness, numbness, or tingling in the area of your body that is supplied by the nerve. The pain may feel sharp and burning. Depending on which spinal nerve is affected, the pain may occur in the:  Neck area (cervical radicular pain). You may also feel pain, numbness, weakness, or tingling in the arms.  Mid-spine area (thoracic radicular pain). You would feel this pain in the back and chest. This type is rare.  Lower back area (lumbar radicular pain). You would feel this pain as low back pain. You may feel pain, numbness, weakness, or tingling in the buttocks or legs. Sciatica is a type of lumbar radicular pain that shoots down the back of the leg. Radicular pain occurs when one of the spinal nerves becomes irritated or squeezed (compressed). It is often caused by something pushing on a spinal nerve, such as one of the bones of the spine (vertebrae) or one of the round cushions between vertebrae (intervertebral disks). This can result from:  An injury.  Wear and tear or aging of a disk.  The growth of a bone spur that pushes on the nerve. Radicular pain often goes away when you follow instructions from your health care provider for relieving pain at home. Follow these instructions at home: Managing pain      If directed, put ice on the affected  area: ? Put ice in a plastic bag. ? Place a towel between your skin and the bag. ? Leave the ice on for 20 minutes, 2-3 times a day.  If directed, apply heat to the affected area as often as told by your health care provider. Use the heat source that your health care provider recommends, such as a moist heat pack or a heating pad. ? Place a towel between your skin and the heat source. ? Leave the heat on for 20-30 minutes. ? Remove the heat if your skin turns bright red. This is especially important if you are unable to feel pain, heat, or cold. You may have a greater risk of getting burned. Activity   Do not sit or rest in bed for long periods of time.  Try to stay as active as possible. Ask your health care provider what type of exercise or activity is best for you.  Avoid activities that make your pain worse, such as bending and lifting.  Do not lift anything that is heavier than 10 lb (4.5 kg), or the limit that you are told, until your health care provider says that it is safe.  Practice using proper technique when lifting items. Proper lifting technique involves bending your knees and rising up.  Do strength and range-of-motion exercises only as told by your health care provider or physical therapist. General instructions  Take over-the-counter and  prescription medicines only as told by your health care provider.  Pay attention to any changes in your symptoms.  Keep all follow-up visits as told by your health care provider. This is important. ? Your health care provider may send you to a physical therapist to help with this pain. Contact a health care provider if:  Your pain and other symptoms get worse.  Your pain medicine is not helping.  Your pain has not improved after a few weeks of home care.  You have a fever. Get help right away if:  You have severe pain, weakness, or numbness.  You have difficulty with bladder or bowel control. Summary  Radicular pain is  a type of pain that spreads from your back or neck along a spinal nerve.  When you have radicular pain, you may also have weakness, numbness, or tingling in the area of your body that is supplied by the nerve.  The pain may feel sharp or burning.  Radicular pain may be treated with ice, heat, medicines, or physical therapy. This information is not intended to replace advice given to you by your health care provider. Make sure you discuss any questions you have with your health care provider. Document Released: 12/10/2004 Document Revised: 05/17/2018 Document Reviewed: 05/17/2018 Elsevier Patient Education  2020 [employer].
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
## 2019-11-09 ENCOUNTER — Ambulatory Visit: Payer: BC Managed Care – PPO | Attending: Family Medicine | Admitting: Physical Therapy

## 2019-11-09 ENCOUNTER — Encounter: Payer: Self-pay | Admitting: Physical Therapy

## 2019-11-09 ENCOUNTER — Other Ambulatory Visit: Payer: Self-pay

## 2019-11-09 DIAGNOSIS — M6281 Muscle weakness (generalized): Secondary | ICD-10-CM | POA: Diagnosis not present

## 2019-11-09 DIAGNOSIS — M5441 Lumbago with sciatica, right side: Secondary | ICD-10-CM | POA: Insufficient documentation

## 2019-11-09 DIAGNOSIS — M5442 Lumbago with sciatica, left side: Secondary | ICD-10-CM | POA: Insufficient documentation
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
## 2019-11-09 NOTE — Patient Instructions (Signed)
Access Code: [email or web address]  URL: [email or web address]  Date: 11/09/2019  Prepared by: Ruben Im   Exercises Prone Press Up - 10 reps - 1 sets - 7x daily - 7x weekly Standing Lumbar Extension - 10 reps - 1 sets - 1x daily - 7x weekly

## 2019-11-16 ENCOUNTER — Telehealth: Payer: Self-pay | Admitting: Family Medicine

## 2019-11-16 NOTE — Telephone Encounter (Signed)
Pharmacist, Myra Rude, from [place] [phone]) called requesting a prescription for Diclofenac Sodium 1% and Lidocaine Ointment 5% as two seperate prescriptions. The patient was asking for her to make a compound of the two for him to apply topically.  Can this be sent in for him?

## 2019-11-20 ENCOUNTER — Other Ambulatory Visit: Payer: Self-pay

## 2019-11-20 ENCOUNTER — Encounter: Payer: BC Managed Care – PPO | Attending: Surgery | Admitting: Skilled Nursing Facility1

## 2019-11-20 DIAGNOSIS — E669 Obesity, unspecified: Secondary | ICD-10-CM | POA: Diagnosis not present

## 2019-11-20 MED ORDER — DICLOFENAC SODIUM 1 % EX GEL: 4.0000 g | Freq: Four times a day (QID) | CUTANEOUS | 11 refills | Status: AC

## 2019-11-20 MED ORDER — LIDOCAINE 5 % EX OINT: 1.0000 "application " | TOPICAL_OINTMENT | Freq: Four times a day (QID) | CUTANEOUS | 11 refills | Status: AC | PRN
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
## 2019-11-20 NOTE — Telephone Encounter (Signed)
Requested prescription sent electronically to pharmacy.

## 2019-11-21 ENCOUNTER — Encounter: Payer: Self-pay | Admitting: Physical Therapy

## 2019-11-21 ENCOUNTER — Ambulatory Visit: Payer: BC Managed Care – PPO | Attending: Family Medicine | Admitting: Physical Therapy

## 2019-11-21 DIAGNOSIS — M5441 Lumbago with sciatica, right side: Secondary | ICD-10-CM | POA: Diagnosis not present

## 2019-11-21 DIAGNOSIS — M6281 Muscle weakness (generalized): Secondary | ICD-10-CM | POA: Insufficient documentation

## 2019-11-21 DIAGNOSIS — M5442 Lumbago with sciatica, left side: Secondary | ICD-10-CM | POA: Diagnosis not present

## 2019-11-21 NOTE — Patient Instructions (Signed)
Access Code: [email or web address]  URL: [email or web address]  Date: 11/21/2019  Prepared by: Ruben Im   Exercises Prone Press Up - 10 reps - 1 sets - 7x daily - 7x weekly Standing Lumbar Extension - 10 reps - 1 sets - 1x daily - 7x weekly Supine Transversus Abdominis Bracing - Hands on Stomach - 10 reps - 1 sets - 5 hold - 1x daily - 7x weekly Cat-Camel - 10 reps - 1 sets - 1x daily - 7x weekly

## 2019-11-21 NOTE — Therapy (Signed)
[facility] [address], [place], [place], [address] Phone: [phone]   Fax:  [phone]  Physical Therapy Treatment  Patient Details  Name: Mark Lang MRN: [id number] Date of Birth: May 26, 1971 Referring Provider (PT): Dr. Lynne Leader    Encounter Date: 11/21/2019  PT End of Session - 11/21/19 0838    Visit Number  2    Date for PT Re-Evaluation  01/04/20    Authorization Type  BCBS    PT Start Time  0753    PT Stop Time  0840    PT Time Calculation (min)  47 min    Activity Tolerance  Patient tolerated treatment well       Past Medical History:  Diagnosis Date  . Diabetes mellitus, type 2 ([place])   . GERD (gastroesophageal reflux disease)   . Headache   . HTN (hypertension)    dr Einar Gip  cardiologist , reports he may have had a chemical stress test done   . Hypercholesteremia   . Insomnia   . Morbid obesity ([place])   . Neoplasm of tongue   . Seasonal allergies   . Vitamin D deficiency     Past Surgical History:  Procedure Laterality Date  . GASTRIC ROUX-EN-Y N/A 01/31/2019   Procedure: LAPAROSCOPIC ROUX-EN-Y GASTRIC BYPASS WITH UPPER ENDOSCOPY, Upper Endo, ERAS Pathway;  Surgeon: Johnathan Hausen, MD;  Location: [facility];  Service: General;  Laterality: N/A;  . UPPER GI ENDOSCOPY    . WISDOM TOOTH EXTRACTION      There were no vitals filed for this visit.  Subjective Assessment - 11/21/19 0753    Subjective  Felt better for 20 minutes after doing the exs then the pain returns.    Currently in Pain?  Yes    Pain Score  5     Pain Location  Back    Pain Orientation  Right    Pain Type  Acute pain    Pain Radiating Towards  right thigh and calf                       [facility] Adult PT Treatment/Exercise - 11/21/19 0001      Lumbar Exercises: Stretches   Other Lumbar Stretch Exercise  doorway psoas stretch with and without UE 3x5       Lumbar Exercises: Supine   Ab Set  5 reps    Bent  Knee Raise  5 reps    Bent Knee Raise Limitations  painful    Isometric Hip Flexion  5 reps      Lumbar Exercises: Sidelying   Other Sidelying Lumbar Exercises  open books 10x right/left       Lumbar Exercises: Prone   Other Prone Lumbar Exercises  press ups 2x10    Other Prone Lumbar Exercises  press ups with sheet overpressure 10x      Lumbar Exercises: Quadruped   Madcat/Old Horse  10 reps    Other Quadruped Lumbar Exercises  childs pose 3x       Moist Heat Therapy   Number Minutes Moist Heat  12 Minutes    Moist Heat Location  Lumbar Spine      Electrical Stimulation   Electrical Stimulation Location  thoracic and lumbar     Electrical Stimulation Action  IFC    Electrical Stimulation Parameters  13 ma 12 min prone    Electrical Stimulation Goals  Pain      Manual Therapy  Joint Mobilization  L1-L5 grade 3 extension mobs 20 sec each level              PT Education - 11/21/19 0832    Education Details  Access Code: [email or web address] press ups with overpressure; cat cow; abdominal brace    Person(s) Educated  Patient    Methods  Explanation;Demonstration;Handout    Comprehension  Returned demonstration;Verbalized understanding       PT Short Term Goals - 11/09/19 0956      PT SHORT TERM GOAL #1   Title  The patient will demonstrate knowledge of basic self care strategies including change of positon, use of lumbar roll, initial ex's    Time  4    Period  Weeks    Status  New    Target Date  12/07/19      PT SHORT TERM GOAL #2   Title  The patient will report a 40% improvement in back and LE symptoms with home and work [employer].    Time  4    Period  Weeks    Status  New      PT SHORT TERM GOAL #3   Title  The patient will have improved lumbar ROM with flexion to 40 degrees and extensiont to 25 degrees needed for home and work ADLS    Time  4    Period  Weeks    Status  New        PT Long Term Goals - 11/09/19 0959      PT LONG TERM GOAL #1   Title  The  patient will be independent in safe self progression of HEP and plan to return to [facility] and/or running program    Time  8    Period  Weeks    Status  New    Target Date  01/04/20      PT LONG TERM GOAL #2   Title  The patient will report a 75% reduction in back and associated bil LE symptoms with usual home and work ADLs    Time  8    Period  Weeks    Status  New      PT LONG TERM GOAL #3   Title  The patient will have lumbo/pelvic/hip strength to grossly 4+/5 to 5-/5 needed for running and lifting    Time  8    Period  Weeks    Status  New      PT LONG TERM GOAL #4   Title  The patient will have full lumbar flexion ROM to 60 degrees and extension to 30 degrees needed for home and work ADLS    Time  8    Period  Weeks    Status  New      PT LONG TERM GOAL #5   Title  FOTO functional outcome score improved from 54% limitation to 33%    Time  8    Period  Weeks    Status  New            Plan - 11/21/19 [id number]    Clinical Impression Statement  Much improved lumbar mobility noted.  Following press ups, pain no longer in calf and thigh.  His primary complaint today is bilateral pain in latissimus muscle regions and lower thoracic regions.  He needs moderate cues to activate transverse abdominus muscles and maintain with LE movements.  Discussed avoiding end range tissue stretching (slouch sitting with legs extended) for a little longer to  allow healing.  Good response to ES/heat.  Therapist monitoring response throughout treatment session.    Comorbidities  diabetes; gastric bypass    Examination-Activity Limitations  Lift;Stairs;Locomotion Level;Sit;Squat    Examination-Participation Restrictions  Community Activity;Other    Rehab Potential  Good    PT Frequency  2x / week    PT Duration  8 weeks    PT Treatment/Interventions  ADLs/Self Care Home Management;Cryotherapy;Electrical Stimulation;Ultrasound;Traction;Moist Heat;Iontophoresis 4mg /ml Dexamethasone;Therapeutic  activities;Therapeutic exercise;Neuromuscular re-education;Manual techniques;Taping;Dry needling;Spinal Manipulations    PT Next Visit Plan  press ups with overpressure; low level core stabilization; instruct in hip hinge;   possible DN; ES/heat    PT Home Exercise Plan  Access Code: [email or web address]       Patient will benefit from skilled therapeutic intervention in order to improve the following deficits and impairments:  Decreased range of motion, Increased muscle spasms, Decreased activity tolerance, Pain, Decreased strength, Postural dysfunction  Visit Diagnosis: Acute bilateral low back pain with bilateral sciatica  Muscle weakness (generalized)     Problem List Patient Active Problem List   Diagnosis Date Noted  . 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 Metabolic syndrome 123456   Ruben Im, PT 11/21/19 9:04 AM Phone: [phone] Fax: [phone] Alvera Singh 11/21/2019, 9:04 AM  [facility] [address], [place], [place], [address] Phone: [phone]   Fax:  [phone]  Name: Idriss Robello MRN: [id number] Date of Birth: 1971/10/08

## 2019-11-23 ENCOUNTER — Other Ambulatory Visit: Payer: Self-pay

## 2019-11-23 ENCOUNTER — Ambulatory Visit: Payer: BC Managed Care – PPO | Admitting: Physical Therapy

## 2019-11-23 DIAGNOSIS — M5441 Lumbago with sciatica, right side: Secondary | ICD-10-CM | POA: Diagnosis not present

## 2019-11-23 DIAGNOSIS — M6281 Muscle weakness (generalized): Secondary | ICD-10-CM | POA: Diagnosis not present

## 2019-11-23 DIAGNOSIS — M5442 Lumbago with sciatica, left side: Secondary | ICD-10-CM | POA: Diagnosis not present

## 2019-11-23 NOTE — Patient Instructions (Signed)
Access Code: [email or web address]  URL: [email or web address]  Date: 11/23/2019  Prepared by: Ruben Im   Exercises Prone Press Up - 10 reps - 1 sets - 7x daily - 7x weekly Standing Lumbar Extension - 10 reps - 1 sets - 1x daily - 7x weekly Supine Transversus Abdominis Bracing - Hands on Stomach - 10 reps - 1 sets - 5 hold - 1x daily - 7x weekly Cat-Camel - 10 reps - 1 sets - 1x daily - 7x weekly Supine Sciatic Nerve Glide - 10 reps - 1 sets - 1x daily - 7x weekly Hooklying Isometric Hip Flexion - 10 reps - 1 sets - 5 hold - 1x daily - 7x weekly Supine Figure 4 Piriformis Stretch - 3 reps - 1 sets - 20 hold - 1x daily - 7x weekly

## 2019-11-23 NOTE — Therapy (Signed)
[facility] [address] [place], [place], [address] Phone: [phone]   Fax:  [phone]  Physical Therapy Treatment  Patient Details  Name: Mark Lang MRN: [id number] Date of Birth: December 28, 1970 Referring Provider (PT): Dr. Lynne Leader    Encounter Date: 11/23/2019  PT End of Session - 11/23/19 0843    Visit Number  3    Date for PT Re-Evaluation  01/04/20    Authorization Type  BCBS    PT Start Time  0758    PT Stop Time  0844    PT Time Calculation (min)  46 min    Activity Tolerance  Patient tolerated treatment well       Past Medical History:  Diagnosis Date  . Diabetes mellitus, type 2 ([place])   . GERD (gastroesophageal reflux disease)   . Headache   . HTN (hypertension)    dr Einar Gip  cardiologist , reports he may have had a chemical stress test done   . Hypercholesteremia   . Insomnia   . Morbid obesity ([place])   . Neoplasm of tongue   . Seasonal allergies   . Vitamin D deficiency     Past Surgical History:  Procedure Laterality Date  . GASTRIC ROUX-EN-Y N/A 01/31/2019   Procedure: LAPAROSCOPIC ROUX-EN-Y GASTRIC BYPASS WITH UPPER ENDOSCOPY, Upper Endo, ERAS Pathway;  Surgeon: Johnathan Hausen, MD;  Location: [facility];  Service: General;  Laterality: N/A;  . UPPER GI ENDOSCOPY    . WISDOM TOOTH EXTRACTION      There were no vitals filed for this visit.  Subjective Assessment - 11/23/19 0802    Subjective  I accidentally pulled on something 20# yesterday and that hurt my shoulder.  I don't think I can do the press ups or do the arm bike b/c of that.  Yesterday my low back felt better.  It was good yesterday.    Pertinent History  Diabetes; gastric bypass    Currently in Pain?  Yes    Pain Score  4     Pain Location  Back    Pain Orientation  Right    Pain Type  Acute pain                       [facility] Adult PT Treatment/Exercise - 11/23/19 0001      Lumbar Exercises: Stretches   Active Hamstring Stretch  Right;Left;5 reps    Active Hamstring Stretch Limitations  supine using hands behind knee    Single Knee to Chest Stretch  Right;Left;5 reps    Piriformis Stretch  Right;Left;3 reps;20 seconds    Piriformis Stretch Limitations  supine      Lumbar Exercises: Standing   Other Standing Lumbar Exercises  lumbar extensions in front of tall table 10x       Lumbar Exercises: Supine   Ab Set  10 reps    AB Set Limitations  push into green ball with UEs and knees       Lumbar Exercises: Prone   Other Prone Lumbar Exercises  press ups 5x      Moist Heat Therapy   Number Minutes Moist Heat  12 Minutes    Moist Heat Location  Lumbar Spine      Electrical Stimulation   Electrical Stimulation Location  thoracic and lumbar     Electrical Stimulation Action  IFC    Electrical Stimulation Parameters  20 ma 12 min prone    Electrical Stimulation  Goals  Pain      Manual Therapy   Manual therapy comments  bil long leg pull 60 sec; right hip inferior mob; sidelying smile mob and neutral gapping grade 3 3x 30 sec     Joint Mobilization  L1-L5 grade 3 extension mobs 20 sec each level              PT Education - 11/23/19 0842    Education Details  Access Code: [email or web address] supine floss, supine piriformis stretch, hand to knee push    Person(s) Educated  Patient    Methods  Explanation;Demonstration;Handout    Comprehension  Returned demonstration;Verbalized understanding       PT Short Term Goals - 11/09/19 0956      PT SHORT TERM GOAL #1   Title  The patient will demonstrate knowledge of basic self care strategies including change of positon, use of lumbar roll, initial ex's    Time  4    Period  Weeks    Status  New    Target Date  12/07/19      PT SHORT TERM GOAL #2   Title  The patient will report a 40% improvement in back and LE symptoms with home and work [employer].    Time  4    Period  Weeks    Status  New      PT SHORT TERM GOAL #3   Title  The patient  will have improved lumbar ROM with flexion to 40 degrees and extensiont to 25 degrees needed for home and work ADLS    Time  4    Period  Weeks    Status  New        PT Long Term Goals - 11/09/19 0959      PT LONG TERM GOAL #1   Title  The patient will be independent in safe self progression of HEP and plan to return to [facility] and/or running program    Time  8    Period  Weeks    Status  New    Target Date  01/04/20      PT LONG TERM GOAL #2   Title  The patient will report a 75% reduction in back and associated bil LE symptoms with usual home and work ADLs    Time  8    Period  Weeks    Status  New      PT LONG TERM GOAL #3   Title  The patient will have lumbo/pelvic/hip strength to grossly 4+/5 to 5-/5 needed for running and lifting    Time  8    Period  Weeks    Status  New      PT LONG TERM GOAL #4   Title  The patient will have full lumbar flexion ROM to 60 degrees and extension to 30 degrees needed for home and work ADLS    Time  8    Period  Weeks    Status  New      PT LONG TERM GOAL #5   Title  FOTO functional outcome score improved from 54% limitation to 33%    Time  8    Period  [place] - 11/23/19 [id number]    Clinical Impression Statement  The patient has improved lumbar mobility with fewer complaints of back pain.  New onset shoulder pain is the primary complaint today.  Improved activation of transverse abdominus muscle activation.  Therapist closey monitoring response with all interventions.    Comorbidities  diabetes; gastric bypass    Examination-Activity Limitations  Lift;Stairs;Locomotion Level;Sit;Squat    Rehab Potential  Good    PT Frequency  2x / week    PT Duration  8 weeks    PT Treatment/Interventions  ADLs/Self Care Home Management;Cryotherapy;Electrical Stimulation;Ultrasound;Traction;Moist Heat;Iontophoresis 4mg /ml Dexamethasone;Therapeutic activities;Therapeutic exercise;Neuromuscular re-education;Manual  techniques;Taping;Dry needling;Spinal Manipulations    PT Next Visit Plan  press ups with overpressure; low level core stabilization; hip hinge;   possible DN; ES/heat    PT Home Exercise Plan  Access Code: [email or web address]       Patient will benefit from skilled therapeutic intervention in order to improve the following deficits and impairments:  Decreased range of motion, Increased muscle spasms, Decreased activity tolerance, Pain, Decreased strength, Postural dysfunction  Visit Diagnosis: Acute bilateral low back pain with bilateral sciatica  Muscle weakness (generalized)     Problem List Patient Active Problem List   Diagnosis Date Noted  . 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 Metabolic syndrome 123456   Ruben Im, PT 11/23/19 5:56 PM Phone: [phone] Fax: [phone] Alvera Singh 11/23/2019, 5:55 PM  [facility] [address], [place], [place], [address] Phone: [phone]   Fax:  [phone]  Name: Torrance Hon MRN: [id number] Date of Birth: 08/31/1971

## 2019-11-28 ENCOUNTER — Ambulatory Visit: Payer: BC Managed Care – PPO | Admitting: Physical Therapy

## 2019-11-28 ENCOUNTER — Other Ambulatory Visit: Payer: Self-pay

## 2019-11-28 DIAGNOSIS — M5442 Lumbago with sciatica, left side: Secondary | ICD-10-CM | POA: Diagnosis not present

## 2019-11-28 DIAGNOSIS — M6281 Muscle weakness (generalized): Secondary | ICD-10-CM | POA: Diagnosis not present

## 2019-11-28 DIAGNOSIS — M5441 Lumbago with sciatica, right side: Secondary | ICD-10-CM | POA: Diagnosis not present

## 2019-11-28 NOTE — Patient Instructions (Signed)
Access Code: [email or web address]  URL: [email or web address]  Date: 11/28/2019  Prepared by: Ruben Im   Exercises Prone Press Up - 10 reps - 1 sets - 7x daily - 7x weekly Standing Lumbar Extension - 10 reps - 1 sets - 1x daily - 7x weekly Supine Transversus Abdominis Bracing - Hands on Stomach - 10 reps - 1 sets - 5 hold - 1x daily - 7x weekly Cat-Camel - 10 reps - 1 sets - 1x daily - 7x weekly Supine Sciatic Nerve Glide - 10 reps - 1 sets - 1x daily - 7x weekly Hooklying Isometric Hip Flexion - 10 reps - 1 sets - 5 hold - 1x daily - 7x weekly Supine Figure 4 Piriformis Stretch - 3 reps - 1 sets - 20 hold - 1x daily - 7x weekly Standing Hip Hinge with Dowel - 10 reps - 1 sets - 1x daily - 7x weekly

## 2019-11-28 NOTE — Therapy (Signed)
[facility] [address] [place], [place], [address] Phone: [phone]   Fax:  [phone]  Physical Therapy Treatment  Patient Details  Name: Mark Lang MRN: [id number] Date of Birth: [birth year] Referring Provider (PT): Dr. Lynne Leader    Encounter Date: 11/28/2019  PT End of Session - 11/28/19 0853    Visit Number  4    Date for PT Re-Evaluation  01/04/20    Authorization Type  BCBS    PT Start Time  [phone]   pt late   PT Stop Time  0900    PT Time Calculation (min)  50 min    Activity Tolerance  Patient tolerated treatment well       Past Medical History:  Diagnosis Date  . Diabetes mellitus, type 2 ([place])   . GERD (gastroesophageal reflux disease)   . Headache   . HTN (hypertension)    dr Einar Gip  cardiologist , reports he may have had a chemical stress test done   . Hypercholesteremia   . Insomnia   . Morbid obesity ([place])   . Neoplasm of tongue   . Seasonal allergies   . Vitamin D deficiency     Past Surgical History:  Procedure Laterality Date  . GASTRIC ROUX-EN-Y N/A 01/31/2019   Procedure: LAPAROSCOPIC ROUX-EN-Y GASTRIC BYPASS WITH UPPER ENDOSCOPY, Upper Endo, ERAS Pathway;  Surgeon: Johnathan Hausen, MD;  Location: [facility];  Service: General;  Laterality: N/A;  . UPPER GI ENDOSCOPY    . WISDOM TOOTH EXTRACTION      There were no vitals filed for this visit.  Subjective Assessment - 11/28/19 0918    Subjective  The pain is better but still here.  Points to scapular, right flank and low back region.  Sometimes right lateral/posterior right thigh.  States he has not been exercising.  Did not get a chance to review dry needling info.    Pertinent History  Diabetes; gastric bypass    Patient Stated Goals  start to exercise again, no pain; get back to running (haven't since before accident)    Currently in Pain?  Yes    Pain Score  5     Pain Location  Back    Pain Orientation  Right                        [facility] Adult PT Treatment/Exercise - 11/28/19 0001      Therapeutic Activites    Other Therapeutic Activities  hip hinge with 3 points of contact 2x 10 20-30 degrees       Lumbar Exercises: Stretches   Press Ups  20 reps      Lumbar Exercises: Standing   Row  Strengthening;Both;20 reps    Row Limitations  25#     Shoulder Extension  Strengthening;Both;20 reps    Shoulder Extension Limitations  25#       Lumbar Exercises: Quadruped   Other Quadruped Lumbar Exercises  childs pose 3x     Other Quadruped Lumbar Exercises  thread the needle with foam roll 5x; open books on right 5x       Moist Heat Therapy   Number Minutes Moist Heat  10 Minutes    Moist Heat Location  Lumbar Spine      Electrical Stimulation   Electrical Stimulation Location  thoracic and lumbar     Electrical Stimulation Action  IFC    Electrical Stimulation Parameters  21 10 min supine  Electrical Stimulation Goals  Pain      Manual Therapy   Manual therapy comments  flexion/rotation mob grade 3/4 5x 30 sec; smile mobilization grade 3 5x 30 sec     Joint Mobilization  L1-L5 grade 3 extension mobs 20 sec each level     Soft tissue mobilization  Addaday instrument assisted to right lats, subscapularis, gluteals and HS             PT Education - 11/28/19 0908    Education Details  hip hinge with 3 points of contact    Person(s) Educated  Patient    Methods  Explanation;Demonstration;Handout    Comprehension  Returned demonstration;Verbalized understanding       PT Short Term Goals - 11/09/19 0956      PT SHORT TERM GOAL #1   Title  The patient will demonstrate knowledge of basic self care strategies including change of positon, use of lumbar roll, initial ex's    Time  4    Period  Weeks    Status  New    Target Date  12/07/19      PT SHORT TERM GOAL #2   Title  The patient will report a 40% improvement in back and LE symptoms with home and work [employer].     Time  4    Period  Weeks    Status  New      PT SHORT TERM GOAL #3   Title  The patient will have improved lumbar ROM with flexion to 40 degrees and extensiont to 25 degrees needed for home and work ADLS    Time  4    Period  Weeks    Status  New        PT Long Term Goals - 11/09/19 0959      PT LONG TERM GOAL #1   Title  The patient will be independent in safe self progression of HEP and plan to return to [facility] and/or running program    Time  8    Period  Weeks    Status  New    Target Date  01/04/20      PT LONG TERM GOAL #2   Title  The patient will report a 75% reduction in back and associated bil LE symptoms with usual home and work ADLs    Time  8    Period  Weeks    Status  New      PT LONG TERM GOAL #3   Title  The patient will have lumbo/pelvic/hip strength to grossly 4+/5 to 5-/5 needed for running and lifting    Time  8    Period  Weeks    Status  New      PT LONG TERM GOAL #4   Title  The patient will have full lumbar flexion ROM to 60 degrees and extension to 30 degrees needed for home and work ADLS    Time  8    Period  Weeks    Status  New      PT LONG TERM GOAL #5   Title  FOTO functional outcome score improved from 54% limitation to 33%    Time  8    Period  [place] - 11/28/19 [id number]    Clinical Impression Statement  The patient reports decreasing pain overall but persists right sided scapular/thoracic and lumbar regions.  He is able to continue with mobility ex's.  Symptoms seem to be more muscular in presentation rather than following a radicular or derangement pattern.  He has difficulty with hip hinge method and requires heavy verbal and tactile cues and will need further review.  Good temporary relief with ES/heat and manual techniques.    Comorbidities  diabetes; gastric bypass    Examination-Activity Limitations  Lift;Stairs;Locomotion Level;Sit;Squat    Rehab Potential  Good    PT Frequency  2x / week    PT  Duration  8 weeks    PT Treatment/Interventions  ADLs/Self Care Home Management;Cryotherapy;Electrical Stimulation;Ultrasound;Traction;Moist Heat;Iontophoresis 4mg /ml Dexamethasone;Therapeutic activities;Therapeutic exercise;Neuromuscular re-education;Manual techniques;Taping;Dry needling;Spinal Manipulations    PT Next Visit Plan  press ups with overpressure; low level core stabilization;  review hip hinge and progress to dead lift;   possible DN; ES/heat    PT Home Exercise Plan  Access Code: [email or web address]       Patient will benefit from skilled therapeutic intervention in order to improve the following deficits and impairments:  Decreased range of motion, Increased muscle spasms, Decreased activity tolerance, Pain, Decreased strength, Postural dysfunction  Visit Diagnosis: Acute bilateral low back pain with bilateral sciatica  Muscle weakness (generalized)     Problem List Patient Active Problem List   Diagnosis Date Noted  . 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 Metabolic syndrome 123456   Ruben Im, PT 11/28/19 9:23 AM Phone: [phone] Fax: [phone] Alvera Singh 11/28/2019, 9:23 AM  [facility] [address] [place], [place], [address] Phone: [phone]   Fax:  [phone]  Name: Nehal Werley MRN: [id number] Date of Birth: 1971/10/29

## 2019-11-30 ENCOUNTER — Ambulatory Visit: Payer: BC Managed Care – PPO | Admitting: Physical Therapy

## 2019-12-05 ENCOUNTER — Ambulatory Visit: Payer: BC Managed Care – PPO | Admitting: Physical Therapy

## 2019-12-05 ENCOUNTER — Other Ambulatory Visit: Payer: Self-pay

## 2019-12-05 DIAGNOSIS — M6281 Muscle weakness (generalized): Secondary | ICD-10-CM | POA: Diagnosis not present

## 2019-12-05 DIAGNOSIS — M5442 Lumbago with sciatica, left side: Secondary | ICD-10-CM | POA: Diagnosis not present

## 2019-12-05 DIAGNOSIS — M5441 Lumbago with sciatica, right side: Secondary | ICD-10-CM

## 2019-12-05 NOTE — Therapy (Signed)
[facility] [address] [place], [place], [address] Phone: [phone]   Fax:  [phone]  Physical Therapy Treatment  Patient Details  Name: Mark Lang MRN: [id number] Date of Birth: 1971/05/18 Referring Provider (PT): Dr. Lynne Leader    Encounter Date: 12/05/2019  PT End of Session - 12/05/19 1750    Visit Number  5    Date for PT Re-Evaluation  01/04/20    Authorization Type  BCBS    PT Start Time  0806    PT Stop Time  0850    PT Time Calculation (min)  44 min    Activity Tolerance  Patient tolerated treatment well       Past Medical History:  Diagnosis Date  . Diabetes mellitus, type 2 ([place])   . GERD (gastroesophageal reflux disease)   . Headache   . HTN (hypertension)    dr Einar Gip  cardiologist , reports he may have had a chemical stress test done   . Hypercholesteremia   . Insomnia   . Morbid obesity ([place])   . Neoplasm of tongue   . Seasonal allergies   . Vitamin D deficiency     Past Surgical History:  Procedure Laterality Date  . GASTRIC ROUX-EN-Y N/A 01/31/2019   Procedure: LAPAROSCOPIC ROUX-EN-Y GASTRIC BYPASS WITH UPPER ENDOSCOPY, Upper Endo, ERAS Pathway;  Surgeon: Johnathan Hausen, MD;  Location: [facility];  Service: General;  Laterality: N/A;  . UPPER GI ENDOSCOPY    . WISDOM TOOTH EXTRACTION      There were no vitals filed for this visit.  Subjective Assessment - 12/05/19 0806    Subjective  I went to [employer] on Saturday.  Sunday I went to the gym for an hour.  Some back and right thigh pain. Overall 70% better.    Currently in Pain?  Yes    Pain Score  2     Pain Location  Leg    Pain Orientation  Right    Pain Type  Acute pain                       [facility] Adult PT Treatment/Exercise - 12/05/19 0001      Therapeutic Activites    Other Therapeutic Activities  hip hinge with 3 points of contact 2x 10 20-30 degrees       Lumbar Exercises: Stretches   Active Hamstring  Stretch  Right;Left;3 reps;20 seconds    Active Hamstring Stretch Limitations  supine with strap     Single Knee to Chest Stretch  Right;Left;1 rep    Other Lumbar Stretch Exercise  doorway psoas stretch with and without UE 3x5       Lumbar Exercises: Standing   Other Standing Lumbar Exercises  dead lift with 10# 10x       Lumbar Exercises: Sidelying   Other Sidelying Lumbar Exercises  open books 10x right/left       Lumbar Exercises: Prone   Other Prone Lumbar Exercises  press ups 2x10      Lumbar Exercises: Quadruped   [place]  10 reps      Moist Heat Therapy   Number Minutes Moist Heat  12 Minutes    Moist Heat Location  Lumbar Spine      Electrical Stimulation   Electrical Stimulation Location  thoracic and lumbar     Electrical Stimulation Action  IFC     Electrical Stimulation Parameters  25 ma 12 min prone  Electrical Stimulation Goals  Pain      Manual Therapy   Manual therapy comments  flexion/rotation mob grade 3/4 5x 30 sec; smile mobilization grade 3 5x 30 sec     Joint Mobilization  L1-L5 grade 3 extension mobs 20 sec each level     Soft tissue mobilization  Addaday instrument assisted to right lats, subscapularis, gluteals and HS               PT Short Term Goals - 12/05/19 1753      PT SHORT TERM GOAL #1   Title  The patient will demonstrate knowledge of basic self care strategies including change of positon, use of lumbar roll, initial ex's    Status  Achieved      PT SHORT TERM GOAL #2   Title  The patient will report a 40% improvement in back and LE symptoms with home and work [employer].    Status  Achieved      PT SHORT TERM GOAL #3   Title  The patient will have improved lumbar ROM with flexion to 40 degrees and extensiont to 25 degrees needed for home and work ADLS    Time  4    Period  Weeks    Status  On-going        PT Long Term Goals - 12/05/19 1753      PT LONG TERM GOAL #1   Title  The patient will be independent in safe  self progression of HEP and plan to return to [facility] and/or running program    Time  8    Period  Weeks    Status  On-going    Target Date  01/04/20      PT LONG TERM GOAL #2   Title  The patient will report a 75% reduction in back and associated bil LE symptoms with usual home and work ADLs    Time  8    Period  Weeks    Status  On-going      PT LONG TERM GOAL #3   Title  The patient will have lumbo/pelvic/hip strength to grossly 4+/5 to 5-/5 needed for running and lifting    Time  8    Period  Weeks    Status  On-going      PT LONG TERM GOAL #4   Title  The patient will have full lumbar flexion ROM to 60 degrees and extension to 30 degrees needed for home and work ADLS    Time  8    Period  Weeks    Status  On-going      PT LONG TERM GOAL #5   Title  FOTO functional outcome score improved from 54% limitation to 33%    Time  8    Period  Weeks    Status  On-going            Plan - 12/05/19 1750    Clinical Impression Statement  The patient rates his overall improvement at 70% since start of care. Improved lumbar and thoracic mobility with a positive response to flexion/rotation mobilization.  Continues to have neural limitations on right but much improved from initial assessment.  Therapist closely monitoring response with all treatment interventions.    Rehab Potential  Good    PT Frequency  2x / week    PT Duration  8 weeks    PT Treatment/Interventions  ADLs/Self Care Home Management;Cryotherapy;Electrical Stimulation;Ultrasound;Traction;Moist Heat;Iontophoresis 4mg /ml Dexamethasone;Therapeutic activities;Therapeutic exercise;Neuromuscular re-education;Manual techniques;Taping;Dry  needling;Spinal Manipulations    PT Next Visit Plan  recheck ROM;  moderate level core stabilization;  review hip hinge and progress to dead lift;  manual therapy;  ES/heat    PT Home Exercise Plan  Access Code: [email or web address]       Patient will benefit from skilled therapeutic intervention in  order to improve the following deficits and impairments:  Decreased range of motion, Increased muscle spasms, Decreased activity tolerance, Pain, Decreased strength, Postural dysfunction  Visit Diagnosis: Acute bilateral low back pain with bilateral sciatica  Muscle weakness (generalized)     Problem List Patient Active Problem List   Diagnosis Date Noted  . 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 Metabolic syndrome 123456   Ruben Im, PT 12/05/19 5:55 PM Phone: [phone] Fax: [phone] Alvera Singh 12/05/2019, 5:54 PM  [facility] [address] [place], [place], [address] Phone: [phone]   Fax:  [phone]  Name: Angie Favret MRN: [id number] Date of Birth: [birth year]

## 2019-12-07 ENCOUNTER — Ambulatory Visit: Payer: BC Managed Care – PPO | Admitting: Physical Therapy

## 2019-12-08 ENCOUNTER — Ambulatory Visit: Payer: BC Managed Care – PPO | Admitting: Family Medicine
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
## 2019-12-12 ENCOUNTER — Other Ambulatory Visit: Payer: Self-pay

## 2019-12-12 ENCOUNTER — Ambulatory Visit: Payer: BC Managed Care – PPO | Admitting: Physical Therapy

## 2019-12-12 DIAGNOSIS — M5441 Lumbago with sciatica, right side: Secondary | ICD-10-CM | POA: Diagnosis not present

## 2019-12-12 DIAGNOSIS — M5442 Lumbago with sciatica, left side: Secondary | ICD-10-CM

## 2019-12-12 DIAGNOSIS — M6281 Muscle weakness (generalized): Secondary | ICD-10-CM

## 2019-12-12 NOTE — Patient Instructions (Addendum)
     Trigger Point Dry Needling  . What is Trigger Point Dry Needling (DN)? o DN is a physical therapy technique used to treat muscle pain and dysfunction. Specifically, DN helps deactivate muscle trigger points (muscle knots).  o A thin filiform needle is used to penetrate the skin and stimulate the underlying trigger point. The goal is for a local twitch response (LTR) to occur and for the trigger point to relax. No medication of any kind is injected during the procedure.   . What Does Trigger Point Dry Needling Feel Like?  o The procedure feels different for each individual patient. Some patients report that they do not actually feel the needle enter the skin and overall the process is not painful. Very mild bleeding may occur. However, many patients feel a deep cramping in the muscle in which the needle was inserted. This is the local twitch response.   Marland Kitchen How Will I feel after the treatment? o Soreness is normal, and the onset of soreness may not occur for a few hours. Typically this soreness does not last longer than two days.  o Bruising is uncommon, however; ice can be used to decrease any possible bruising.  o In rare cases feeling tired or nauseous after the treatment is normal. In addition, your symptoms may get worse before they get better, this period will typically not last longer than 24 hours.   . What Can I do After My Treatment? o Increase your hydration by drinking more water for the next 24 hours. o You may place ice or heat on the areas treated that have become sore, however, do not use heat on inflamed or bruised areas. Heat often brings more relief post needling. o You can continue your regular activities, but vigorous activity is not recommended initially after the treatment for 24 hours. o DN is best combined with other physical therapy such as strengthening, stretching, and other therapies.    Access Code: [email or web address]  URL: [email or web address]  Date:  12/12/2019  Prepared by: Ruben Im   Exercises Prone Press Up - 10 reps - 1 sets - 7x daily - 7x weekly Standing Lumbar Extension - 10 reps - 1 sets - 1x daily - 7x weekly Supine Transversus Abdominis Bracing - Hands on Stomach - 10 reps - 1 sets - 5 hold - 1x daily - 7x weekly Cat-Camel - 10 reps - 1 sets - 1x daily - 7x weekly Supine Sciatic Nerve Glide - 10 reps - 1 sets - 1x daily - 7x weekly Hooklying Isometric Hip Flexion - 10 reps - 1 sets - 5 hold - 1x daily - 7x weekly Supine Figure 4 Piriformis Stretch - 3 reps - 1 sets - 20 hold - 1x daily - 7x weekly Standing Hip Hinge with Dowel - 10 reps - 1 sets - 1x daily - 7x weekly Supine Lower Trunk Rotation - 3 reps - 1 sets - 20 hold - 1x daily - 7x weekly  [facility] [address] [place], [place] [address] Phone # [phone] Fax [phone]

## 2019-12-12 NOTE — Therapy (Addendum)
[facility] [address], [place], [place], [address] Phone: [phone]   Fax:  [phone]  Physical Therapy Treatment/Discharge Summary   Patient Details  Name: Mark Lang MRN: [id number] Date of Birth: 1970-11-27 Referring Provider (PT): Dr. Lynne Leader    Encounter Date: 12/12/2019  PT End of Session - 12/12/19 0852    Visit Number  6    Date for PT Re-Evaluation  01/04/20    Authorization Type  BCBS    PT Start Time  0800    PT Stop Time  9767    PT Time Calculation (min)  47 min    Activity Tolerance  Patient tolerated treatment well       Past Medical History:  Diagnosis Date  . Diabetes mellitus, type 2 ([place])   . GERD (gastroesophageal reflux disease)   . Headache   . HTN (hypertension)    dr Einar Gip  cardiologist , reports he may have had a chemical stress test done   . Hypercholesteremia   . Insomnia   . Morbid obesity ([place])   . Neoplasm of tongue   . Seasonal allergies   . Vitamin D deficiency     Past Surgical History:  Procedure Laterality Date  . GASTRIC ROUX-EN-Y N/A 01/31/2019   Procedure: LAPAROSCOPIC ROUX-EN-Y GASTRIC BYPASS WITH UPPER ENDOSCOPY, Upper Endo, ERAS Pathway;  Surgeon: Johnathan Hausen, MD;  Location: [facility];  Service: General;  Laterality: N/A;  . UPPER GI ENDOSCOPY    . WISDOM TOOTH EXTRACTION      There were no vitals filed for this visit.  Subjective Assessment - 12/12/19 0801    Subjective  I went to [facility] last night.  No running but did dumbell and pull ups.    I still feel it right thigh with walking.  Right thoracic flank.    How long can you sit comfortably?  30 minutes    How long can you stand comfortably?  30 minutes    Currently in Pain?  Yes    Pain Score  5     Pain Location  Back    Pain Orientation  Right    Aggravating Factors   mornings                       [facility] Adult PT Treatment/Exercise - 12/12/19 0001      Lumbar Exercises:  Stretches   Active Hamstring Stretch  Right;Left;3 reps;20 seconds    Active Hamstring Stretch Limitations  supine with strap     Single Knee to Chest Stretch  Right;Left;1 rep      Lumbar Exercises: Prone   Single Arm Raise  Right;Left;5 reps    Straight Leg Raise  5 reps      Lumbar Exercises: Quadruped   Madcat/Old Horse  10 reps    Other Quadruped Lumbar Exercises  rocking forward and back with crossed ankles       Moist Heat Therapy   Number Minutes Moist Heat  12 Minutes    Moist Heat Location  Lumbar Spine      Electrical Stimulation   Electrical Stimulation Location  thoracic and lumbar     Electrical Stimulation Action  IFC    Electrical Stimulation Parameters  25 ma 12 min prone    Electrical Stimulation Goals  Pain      Manual Therapy   Manual therapy comments  flexion/rotation mob grade 3/4 5x 30 sec; smile mobilization grade 3 5x  30 sec     Joint Mobilization  L1-L5 grade 3 extension mobs 20 sec each level; press ups with manual overpressure 10x; PA L1-5 20 sec each level     Soft tissue mobilization  lower thoracic paraspinals        Trigger Point Dry Needling - 12/12/19 0001    Consent Given?  Yes    Education Handout Provided  Yes    Muscles Treated Back/Hip  Erector spinae    Dry Needling Comments  right only fanning lower thoracic     Erector spinae Response  Palpable increased muscle length           PT Education - 12/12/19 0844    Education Details  Access Code: [email or web address] lumbar rotation stretch;  dry needling after care    Person(s) Educated  Patient    Methods  Explanation;Demonstration;Handout    Comprehension  Returned demonstration;Verbalized understanding       PT Short Term Goals - 12/05/19 1753      PT SHORT TERM GOAL #1   Title  The patient will demonstrate knowledge of basic self care strategies including change of positon, use of lumbar roll, initial ex's    Status  Achieved      PT SHORT TERM GOAL #2   Title  The patient will  report a 40% improvement in back and LE symptoms with home and work [employer].    Status  Achieved      PT SHORT TERM GOAL #3   Title  The patient will have improved lumbar ROM with flexion to 40 degrees and extensiont to 25 degrees needed for home and work ADLS    Time  4    Period  Weeks    Status  On-going        PT Long Term Goals - 12/05/19 1753      PT LONG TERM GOAL #1   Title  The patient will be independent in safe self progression of HEP and plan to return to [facility] and/or running program    Time  8    Period  Weeks    Status  On-going    Target Date  01/04/20      PT LONG TERM GOAL #2   Title  The patient will report a 75% reduction in back and associated bil LE symptoms with usual home and work ADLs    Time  8    Period  Weeks    Status  On-going      PT LONG TERM GOAL #3   Title  The patient will have lumbo/pelvic/hip strength to grossly 4+/5 to 5-/5 needed for running and lifting    Time  8    Period  Weeks    Status  On-going      PT LONG TERM GOAL #4   Title  The patient will have full lumbar flexion ROM to 60 degrees and extension to 30 degrees needed for home and work ADLS    Time  8    Period  Weeks    Status  On-going      PT LONG TERM GOAL #5   Title  FOTO functional outcome score improved from 54% limitation to 33%    Time  8    Period  Weeks    Status  On-going            Plan - 12/12/19 5409    Clinical Impression Statement  The patient has increased symptom sensitivity compared  to last visit particularly with right thigh symptoms.  Encouraged increased performance of prone press ups which relieved LE symptoms.  He's also frustrated with right lower thoracic paraspinal and latissimus dorsi region pain that persists.  Palpable spasm and patient is agreeable to try DN.  Improved soft tissue extensibility following treatment.  Therapist closely monitoring with all treatment interventions.    Comorbidities  diabetes; gastric bypass    Rehab Potential   Good    PT Frequency  2x / week    PT Duration  8 weeks    PT Treatment/Interventions  ADLs/Self Care Home Management;Cryotherapy;Electrical Stimulation;Ultrasound;Traction;Moist Heat;Iontophoresis 14m/ml Dexamethasone;Therapeutic activities;Therapeutic exercise;Neuromuscular re-education;Manual techniques;Taping;Dry needling;Spinal Manipulations    PT Next Visit Plan  recheck ROM;  assess response to DN;  do FOTO;  moderate level core stabilization;  review hip hinge and progress to dead lift;  manual therapy;  ES/heat    PT Home Exercise Plan  Access Code: [email or web address]      Patient will benefit from skilled therapeutic intervention in order to improve the following deficits and impairments:  Decreased range of motion, Increased muscle spasms, Decreased activity tolerance, Pain, Decreased strength, Postural dysfunction  Visit Diagnosis: Acute bilateral low back pain with bilateral sciatica  Muscle weakness (generalized)  PHYSICAL THERAPY DISCHARGE SUMMARY  Visits from Start of Care: 6  Current functional level related to goals / functional outcomes: The patient called to cancel his last scheduled appointment at the end of January and declined rescheduling.  He has not returned in the past 5 weeks; therefore will discharge from PT at this time.     Remaining deficits: As above    Education / Equipment: HEP  Plan: Patient agrees to discharge.  Patient goals were partially met. Patient is being discharged due to not returning since the last visit.  ?????          Problem List Patient Active Problem List   Diagnosis Date Noted  . 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 Metabolic syndrome 035/45/6256  SRuben Im PT 12/12/19 9:00 AM Phone: [phone]: [phone] Singh1/26/2021, 8:59 AM  [facility] [address] [place] [address]Phone: [phone]  Fax:   [phone] Name: Mark GoogeMRN: [id number] of Birth: [birth year]

## 2019-12-14 ENCOUNTER — Ambulatory Visit: Payer: BC Managed Care – PPO | Admitting: Physical Therapy

## 2019-12-19 ENCOUNTER — Other Ambulatory Visit: Payer: Self-pay

## 2019-12-19 ENCOUNTER — Encounter: Payer: Self-pay | Admitting: Family Medicine

## 2019-12-19 ENCOUNTER — Ambulatory Visit (INDEPENDENT_AMBULATORY_CARE_PROVIDER_SITE_OTHER): Payer: BC Managed Care – PPO | Admitting: Family Medicine

## 2019-12-19 VITALS — BP 102/70 | HR 76 | Ht 64.0 in | Wt 173.0 lb

## 2019-12-19 DIAGNOSIS — M25551 Pain in right hip: Secondary | ICD-10-CM | POA: Diagnosis not present

## 2019-12-19 DIAGNOSIS — M5416 Radiculopathy, lumbar region: Secondary | ICD-10-CM | POA: Diagnosis not present

## 2019-12-19 NOTE — Patient Instructions (Addendum)
Thank you for coming in today. Continue some PT.  You should hear about MRI soon.  Call or go to the ER if you develop a large red swollen joint with extreme pain or oozing puss.   Recheck after MRI.  Let me know if you do not hear about MRI scheduling in 1 week.
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
## 2019-12-30 ENCOUNTER — Other Ambulatory Visit: Payer: BC Managed Care – PPO

## 2020-01-01 ENCOUNTER — Other Ambulatory Visit: Payer: BC Managed Care – PPO

## 2020-01-06 ENCOUNTER — Other Ambulatory Visit: Payer: BC Managed Care – PPO

## 2020-01-08 ENCOUNTER — Other Ambulatory Visit: Payer: Self-pay

## 2020-01-08 ENCOUNTER — Ambulatory Visit: Payer: Self-pay

## 2020-01-08 ENCOUNTER — Telehealth: Payer: Self-pay | Admitting: Family Medicine

## 2020-01-08 MED ORDER — LORAZEPAM 0.5 MG PO TABS: ORAL_TABLET | ORAL | 0 refills | Status: AC

## 2020-01-08 NOTE — Telephone Encounter (Signed)
Pt could not tolerate MRI due to anxiety.  MRI rescheduled.  Ativan prescribed.

## 2020-01-13 ENCOUNTER — Other Ambulatory Visit: Payer: Self-pay

## 2020-01-13 ENCOUNTER — Ambulatory Visit (INDEPENDENT_AMBULATORY_CARE_PROVIDER_SITE_OTHER): Payer: BC Managed Care – PPO

## 2020-01-13 DIAGNOSIS — M5416 Radiculopathy, lumbar region: Secondary | ICD-10-CM

## 2020-01-13 DIAGNOSIS — M47816 Spondylosis without myelopathy or radiculopathy, lumbar region: Secondary | ICD-10-CM | POA: Diagnosis not present

## 2020-01-13 DIAGNOSIS — M5126 Other intervertebral disc displacement, lumbar region: Secondary | ICD-10-CM | POA: Diagnosis not present

## 2020-01-15 NOTE — Progress Notes (Signed)
Some arthritis in the low back present.  Additionally bulging disc present at L5-S1.  Return to clinic to discuss MRI results and next steps.

## 2020-01-23 ENCOUNTER — Other Ambulatory Visit: Payer: Self-pay

## 2020-01-23 ENCOUNTER — Encounter: Payer: Self-pay | Admitting: Family Medicine

## 2020-01-23 ENCOUNTER — Ambulatory Visit (INDEPENDENT_AMBULATORY_CARE_PROVIDER_SITE_OTHER): Payer: BC Managed Care – PPO | Admitting: Family Medicine

## 2020-01-23 VITALS — BP 98/60 | HR 71 | Ht 64.0 in | Wt 174.0 lb

## 2020-01-23 DIAGNOSIS — M5416 Radiculopathy, lumbar region: Secondary | ICD-10-CM

## 2020-01-23 NOTE — Patient Instructions (Addendum)
Thank you for coming in today. You have some pain in the low back and some pain radiating down the leg.  Next step is back injection but it will raise your blood sugar a little.  Let me know if you want to do the injections.  Continue home exercises.  Keep me updated.   Epidural Steroid Injection Patient Information  Description: The epidural space surrounds the nerves as they exit the spinal cord.  In some patients, the nerves can be compressed and inflamed by a bulging disc or a tight spinal canal (spinal stenosis).  By injecting steroids into the epidural space, we can bring irritated nerves into direct contact with a potentially helpful medication.  These steroids act directly on the irritated nerves and can reduce swelling and inflammation which often leads to decreased pain.  Epidural steroids may be injected anywhere along the spine and from the neck to the low back depending upon the location of your pain.   After numbing the skin with local anesthetic (like Novocaine), a small needle is passed into the epidural space slowly.  You may experience a sensation of pressure while this is being done.  The entire block usually last less than 10 minutes.  Conditions which may be treated by epidural steroids:   Low back and leg pain  Neck and arm pain  Spinal stenosis  Post-laminectomy syndrome  Herpes zoster (shingles) pain  Pain from compression fractures  Preparation for the injection:  1. Do not eat any solid food or dairy products within 8 hours of your appointment.  2. You may drink clear liquids up to 3 hours before appointment.  Clear liquids include water, black coffee, juice or soda.  No milk or cream please. 3. You may take your regular medication, including pain medications, with a sip of water before your appointment  Diabetics should hold regular insulin (if taken separately) and take 1/2 normal NPH dos the morning of the procedure.  Carry some sugar containing items with  you to your appointment. 4. A driver must accompany you and be prepared to drive you home after your procedure.  5. Bring all your current medications with your. 6. An IV may be inserted and sedation may be given at the discretion of the physician.   7. A blood pressure cuff, EKG and other monitors will often be applied during the procedure.  Some patients may need to have extra oxygen administered for a short period. 8. You will be asked to provide medical information, including your allergies, prior to the procedure.  We must know immediately if you are taking blood thinners (like Coumadin/Warfarin)  Or if you are allergic to IV iodine contrast (dye). We must know if you could possible be pregnant.  Possible side-effects:  Bleeding from needle site  Infection (rare, may require surgery)  Nerve injury (rare)  Numbness & tingling (temporary)  Difficulty urinating (rare, temporary)  Spinal headache ( a headache worse with upright posture)  Light -headedness (temporary)  Pain at injection site (several days)  Decreased blood pressure (temporary)  Weakness in arm/leg (temporary)  Pressure sensation in back/neck (temporary)  Call if you experience:  Fever/chills associated with headache or increased back/neck pain.  Headache worsened by an upright position.  New onset weakness or numbness of an extremity below the injection site  Hives or difficulty breathing (go to the emergency room)  Inflammation or drainage at the infection site  Severe back/neck pain  Any new symptoms which are concerning to you  Please note:  Although the local anesthetic injected can often make your back or neck feel good for several hours after the injection, the pain will likely return.  It takes 3-7 days for steroids to work in the epidural space.  You may not notice any pain relief for at least that one week.  If effective, we will often do a series of three injections spaced 3-6 weeks apart  to maximally decrease your pain.  After the initial series, we generally will wait several months before considering a repeat injection of the same type.
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
## 2020-02-13 ENCOUNTER — Telehealth: Payer: Self-pay | Admitting: Family Medicine

## 2020-02-13 NOTE — Telephone Encounter (Signed)
A little language barrier, but I think pt is interested in scheduling epidural injection as he is still experiencing back pain. Pt states he and Dr. Georgina Snell discussed this after his MRI, and pt has discussed with his PCP due to DM. [phone]

## 2020-02-14 ENCOUNTER — Other Ambulatory Visit: Payer: Self-pay | Admitting: Physical Therapy

## 2020-02-14 DIAGNOSIS — M5416 Radiculopathy, lumbar region: Secondary | ICD-10-CM

## 2020-02-14 DIAGNOSIS — E559 Vitamin D deficiency, unspecified: Secondary | ICD-10-CM | POA: Diagnosis not present

## 2020-02-14 NOTE — Telephone Encounter (Signed)
L5-S1 epidural ordered per Dr. Thompson Caul recommendation and routed to [facility].

## 2020-02-22 ENCOUNTER — Other Ambulatory Visit: Payer: BC Managed Care – PPO

## 2020-02-27 ENCOUNTER — Ambulatory Visit
Admission: RE | Admit: 2020-02-27 | Discharge: 2020-02-27 | Disposition: A | Discharge status: Home / Self Care | Payer: BC Managed Care – PPO | Source: Ambulatory Visit | Attending: Family Medicine | Admitting: Family Medicine

## 2020-02-27 ENCOUNTER — Other Ambulatory Visit: Payer: Self-pay

## 2020-02-27 DIAGNOSIS — M5416 Radiculopathy, lumbar region: Secondary | ICD-10-CM

## 2020-02-27 DIAGNOSIS — M5127 Other intervertebral disc displacement, lumbosacral region: Secondary | ICD-10-CM | POA: Diagnosis not present

## 2020-02-27 MED ORDER — IOPAMIDOL (ISOVUE-M 200) INJECTION 41%
1.0000 mL | Freq: Once | INTRAMUSCULAR | Status: AC
  Administered 2020-02-27: 1 mL via EPIDURAL

## 2020-02-27 MED ORDER — METHYLPREDNISOLONE ACETATE 40 MG/ML INJ SUSP (RADIOLOG
120.0000 mg | Freq: Once | INTRAMUSCULAR | Status: AC
  Administered 2020-02-27: 120 mg via EPIDURAL

## 2020-02-27 NOTE — Discharge Instructions (Signed)

## 2020-03-22 DIAGNOSIS — Z09 Encounter for follow-up examination after completed treatment for conditions other than malignant neoplasm: Secondary | ICD-10-CM | POA: Diagnosis not present

## 2020-07-08 DIAGNOSIS — H6690 Otitis media, unspecified, unspecified ear: Secondary | ICD-10-CM | POA: Diagnosis not present

## 2020-07-08 DIAGNOSIS — J069 Acute upper respiratory infection, unspecified: Secondary | ICD-10-CM | POA: Diagnosis not present

## 2020-07-08 DIAGNOSIS — H9190 Unspecified hearing loss, unspecified ear: Secondary | ICD-10-CM | POA: Diagnosis not present

## 2020-07-15 DIAGNOSIS — M542 Cervicalgia: Secondary | ICD-10-CM | POA: Diagnosis not present

## 2020-07-18 DIAGNOSIS — H9311 Tinnitus, right ear: Secondary | ICD-10-CM | POA: Diagnosis not present

## 2020-07-18 DIAGNOSIS — H93293 Other abnormal auditory perceptions, bilateral: Secondary | ICD-10-CM | POA: Diagnosis not present

## 2020-07-18 DIAGNOSIS — H9201 Otalgia, right ear: Secondary | ICD-10-CM | POA: Diagnosis not present

## 2020-11-11 DIAGNOSIS — Z20822 Contact with and (suspected) exposure to covid-19: Secondary | ICD-10-CM | POA: Diagnosis not present

## 2020-11-11 DIAGNOSIS — U071 COVID-19: Secondary | ICD-10-CM | POA: Diagnosis not present

## 2020-11-13 DIAGNOSIS — Z9189 Other specified personal risk factors, not elsewhere classified: Secondary | ICD-10-CM | POA: Diagnosis not present

## 2020-11-13 DIAGNOSIS — Z9229 Personal history of other drug therapy: Secondary | ICD-10-CM | POA: Diagnosis not present

## 2020-11-13 DIAGNOSIS — U071 COVID-19: Secondary | ICD-10-CM | POA: Diagnosis not present
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
## 2021-06-12 DIAGNOSIS — Z6841 Body Mass Index (BMI) 40.0 and over, adult: Secondary | ICD-10-CM | POA: Diagnosis not present

## 2021-07-25 DIAGNOSIS — Z0000 Encounter for general adult medical examination without abnormal findings: Secondary | ICD-10-CM | POA: Diagnosis not present

## 2021-07-25 DIAGNOSIS — J309 Allergic rhinitis, unspecified: Secondary | ICD-10-CM | POA: Diagnosis not present

## 2021-08-08 DIAGNOSIS — K219 Gastro-esophageal reflux disease without esophagitis: Secondary | ICD-10-CM | POA: Diagnosis not present

## 2021-08-08 DIAGNOSIS — J309 Allergic rhinitis, unspecified: Secondary | ICD-10-CM | POA: Diagnosis not present

## 2021-08-13 DIAGNOSIS — J3089 Other allergic rhinitis: Secondary | ICD-10-CM | POA: Diagnosis not present

## 2021-08-13 DIAGNOSIS — L299 Pruritus, unspecified: Secondary | ICD-10-CM | POA: Diagnosis not present

## 2021-08-13 DIAGNOSIS — Z91018 Allergy to other foods: Secondary | ICD-10-CM | POA: Diagnosis not present

## 2021-08-13 DIAGNOSIS — J302 Other seasonal allergic rhinitis: Secondary | ICD-10-CM | POA: Diagnosis not present

## 2021-08-13 DIAGNOSIS — J019 Acute sinusitis, unspecified: Secondary | ICD-10-CM | POA: Diagnosis not present

## 2021-08-14 DIAGNOSIS — J3089 Other allergic rhinitis: Secondary | ICD-10-CM | POA: Diagnosis not present

## 2021-08-27 DIAGNOSIS — Z91018 Allergy to other foods: Secondary | ICD-10-CM | POA: Diagnosis not present

## 2021-08-27 DIAGNOSIS — R0602 Shortness of breath: Secondary | ICD-10-CM | POA: Diagnosis not present

## 2021-08-27 DIAGNOSIS — L299 Pruritus, unspecified: Secondary | ICD-10-CM | POA: Diagnosis not present

## 2021-08-27 DIAGNOSIS — J3089 Other allergic rhinitis: Secondary | ICD-10-CM | POA: Diagnosis not present

## 2021-08-27 DIAGNOSIS — J302 Other seasonal allergic rhinitis: Secondary | ICD-10-CM | POA: Diagnosis not present

## 2021-08-28 ENCOUNTER — Encounter (HOSPITAL_COMMUNITY): Payer: Self-pay | Admitting: *Deleted

## 2021-09-03 DIAGNOSIS — K1321 Leukoplakia of oral mucosa, including tongue: Secondary | ICD-10-CM | POA: Diagnosis not present

## 2021-09-03 DIAGNOSIS — J3089 Other allergic rhinitis: Secondary | ICD-10-CM | POA: Diagnosis not present

## 2021-09-03 DIAGNOSIS — J343 Hypertrophy of nasal turbinates: Secondary | ICD-10-CM | POA: Diagnosis not present

## 2021-09-03 DIAGNOSIS — J310 Chronic rhinitis: Secondary | ICD-10-CM | POA: Diagnosis not present

## 2021-09-03 DIAGNOSIS — L299 Pruritus, unspecified: Secondary | ICD-10-CM | POA: Diagnosis not present

## 2021-09-03 DIAGNOSIS — J302 Other seasonal allergic rhinitis: Secondary | ICD-10-CM | POA: Diagnosis not present

## 2021-09-03 DIAGNOSIS — J342 Deviated nasal septum: Secondary | ICD-10-CM | POA: Diagnosis not present

## 2021-09-03 DIAGNOSIS — J019 Acute sinusitis, unspecified: Secondary | ICD-10-CM | POA: Diagnosis not present

## 2021-09-05 DIAGNOSIS — Z9884 Bariatric surgery status: Secondary | ICD-10-CM | POA: Diagnosis not present

## 2021-09-05 DIAGNOSIS — Z09 Encounter for follow-up examination after completed treatment for conditions other than malignant neoplasm: Secondary | ICD-10-CM | POA: Diagnosis not present

## 2021-09-24 DIAGNOSIS — J3089 Other allergic rhinitis: Secondary | ICD-10-CM | POA: Diagnosis not present

## 2021-09-24 DIAGNOSIS — J302 Other seasonal allergic rhinitis: Secondary | ICD-10-CM | POA: Diagnosis not present

## 2021-09-24 DIAGNOSIS — H9201 Otalgia, right ear: Secondary | ICD-10-CM | POA: Diagnosis not present

## 2021-09-24 DIAGNOSIS — H6983 Other specified disorders of Eustachian tube, bilateral: Secondary | ICD-10-CM | POA: Diagnosis not present

## 2021-09-26 DIAGNOSIS — J309 Allergic rhinitis, unspecified: Secondary | ICD-10-CM | POA: Diagnosis not present

## 2021-10-01 DIAGNOSIS — Z9109 Other allergy status, other than to drugs and biological substances: Secondary | ICD-10-CM | POA: Diagnosis not present

## 2021-10-08 DIAGNOSIS — J3089 Other allergic rhinitis: Secondary | ICD-10-CM | POA: Diagnosis not present

## 2021-10-08 DIAGNOSIS — J302 Other seasonal allergic rhinitis: Secondary | ICD-10-CM | POA: Diagnosis not present

## 2021-10-14 DIAGNOSIS — J3089 Other allergic rhinitis: Secondary | ICD-10-CM | POA: Diagnosis not present

## 2021-10-14 DIAGNOSIS — J302 Other seasonal allergic rhinitis: Secondary | ICD-10-CM | POA: Diagnosis not present

## 2021-10-22 DIAGNOSIS — J302 Other seasonal allergic rhinitis: Secondary | ICD-10-CM | POA: Diagnosis not present

## 2021-10-22 DIAGNOSIS — J3089 Other allergic rhinitis: Secondary | ICD-10-CM | POA: Diagnosis not present

## 2021-10-27 DIAGNOSIS — J3089 Other allergic rhinitis: Secondary | ICD-10-CM | POA: Diagnosis not present

## 2021-10-27 DIAGNOSIS — L299 Pruritus, unspecified: Secondary | ICD-10-CM | POA: Diagnosis not present

## 2021-10-27 DIAGNOSIS — H6983 Other specified disorders of Eustachian tube, bilateral: Secondary | ICD-10-CM | POA: Diagnosis not present

## 2021-10-27 DIAGNOSIS — J302 Other seasonal allergic rhinitis: Secondary | ICD-10-CM | POA: Diagnosis not present

## 2021-10-29 DIAGNOSIS — J3089 Other allergic rhinitis: Secondary | ICD-10-CM | POA: Diagnosis not present

## 2021-10-29 DIAGNOSIS — J302 Other seasonal allergic rhinitis: Secondary | ICD-10-CM | POA: Diagnosis not present

## 2021-11-05 DIAGNOSIS — J302 Other seasonal allergic rhinitis: Secondary | ICD-10-CM | POA: Diagnosis not present

## 2021-11-05 DIAGNOSIS — J3089 Other allergic rhinitis: Secondary | ICD-10-CM | POA: Diagnosis not present

## 2023-09-01 ENCOUNTER — Encounter (HOSPITAL_COMMUNITY): Payer: Self-pay | Admitting: *Deleted

## 2024-08-23 ENCOUNTER — Encounter (HOSPITAL_COMMUNITY): Payer: Self-pay | Admitting: *Deleted
